# Patient Record
Sex: Female | Born: 1955 | Race: White | Hispanic: No | Marital: Single | Smoking: Never smoker
Health system: Southern US, Community
[De-identification: ages and names within clinical notes are randomized; demographics above are authoritative.]

## PROBLEM LIST (undated history)

## (undated) DIAGNOSIS — E039 Hypothyroidism, unspecified: Secondary | ICD-10-CM

## (undated) HISTORY — DX: Hypothyroidism, unspecified: E03.9

## (undated) HISTORY — PX: DILATION AND CURETTAGE OF UTERUS: SHX78

---

## 1995-07-17 HISTORY — PX: SHOULDER SURGERY: SHX246

## 1998-10-06 ENCOUNTER — Other Ambulatory Visit: Admission: RE | Admit: 1998-10-06 | Discharge: 1998-10-06 | Payer: Self-pay | Admitting: Family Medicine

## 1998-10-31 ENCOUNTER — Other Ambulatory Visit: Admission: RE | Admit: 1998-10-31 | Discharge: 1998-10-31 | Payer: Self-pay | Admitting: Obstetrics and Gynecology

## 1999-04-11 ENCOUNTER — Other Ambulatory Visit: Admission: RE | Admit: 1999-04-11 | Discharge: 1999-04-11 | Payer: Self-pay | Admitting: Obstetrics and Gynecology

## 2000-10-09 ENCOUNTER — Other Ambulatory Visit: Admission: RE | Admit: 2000-10-09 | Discharge: 2000-10-09 | Payer: Self-pay | Admitting: Family Medicine

## 2001-11-07 ENCOUNTER — Other Ambulatory Visit: Admission: RE | Admit: 2001-11-07 | Discharge: 2001-11-07 | Payer: Self-pay | Admitting: Obstetrics and Gynecology

## 2001-11-14 ENCOUNTER — Encounter: Payer: Self-pay | Admitting: Family Medicine

## 2001-11-14 ENCOUNTER — Encounter: Admission: RE | Admit: 2001-11-14 | Discharge: 2001-11-14 | Payer: Self-pay | Admitting: Family Medicine

## 2001-12-24 ENCOUNTER — Encounter: Payer: Self-pay | Admitting: Family Medicine

## 2001-12-24 ENCOUNTER — Encounter: Admission: RE | Admit: 2001-12-24 | Discharge: 2001-12-24 | Payer: Self-pay | Admitting: Family Medicine

## 2002-05-25 ENCOUNTER — Encounter: Admission: RE | Admit: 2002-05-25 | Discharge: 2002-05-25 | Payer: Self-pay

## 2002-06-17 ENCOUNTER — Encounter: Admission: RE | Admit: 2002-06-17 | Discharge: 2002-06-17 | Payer: Self-pay | Admitting: Obstetrics and Gynecology

## 2002-06-17 ENCOUNTER — Encounter: Payer: Self-pay | Admitting: Obstetrics and Gynecology

## 2002-07-13 ENCOUNTER — Encounter (INDEPENDENT_AMBULATORY_CARE_PROVIDER_SITE_OTHER): Payer: Self-pay

## 2002-07-13 ENCOUNTER — Ambulatory Visit (HOSPITAL_COMMUNITY): Admission: RE | Admit: 2002-07-13 | Discharge: 2002-07-13 | Payer: Self-pay

## 2003-12-24 ENCOUNTER — Other Ambulatory Visit: Admission: RE | Admit: 2003-12-24 | Discharge: 2003-12-24 | Payer: Self-pay | Admitting: Family Medicine

## 2003-12-27 ENCOUNTER — Encounter: Admission: RE | Admit: 2003-12-27 | Discharge: 2003-12-27 | Payer: Self-pay | Admitting: Family Medicine

## 2004-02-21 ENCOUNTER — Encounter: Admission: RE | Admit: 2004-02-21 | Discharge: 2004-02-21 | Payer: Self-pay | Admitting: Family Medicine

## 2004-12-22 ENCOUNTER — Other Ambulatory Visit: Admission: RE | Admit: 2004-12-22 | Discharge: 2004-12-22 | Payer: Self-pay | Admitting: Family Medicine

## 2005-03-15 IMAGING — US US TRANSVAGINAL NON-OB
1 series · 14 of 25 positions shown · non-contrast
Comparison: none

CLINICAL DATA: Evaluate left ovary. 
 ULTRASOUND OF THE PELVIS ? TRANSABDOMINAL AND TRANSVAGINAL
 Both transabdominal and transvaginal ultrasound of the pelvis were performed. The uterus is normal in size measuring 9.2 cm sagittally with a depth of 4.2 cm and width of 6.0 cm.  The endometrium is normal measuring 2.2 mm in thickness.  The right ovary measures 2.5 x 1.4 x 1.9 cm with small cysts of less than a cm present.  The left ovary measures 2.1 x 1.2 x 2.0 cm.  No free fluid is seen. 
 IMPRESSION
 Negative pelvic ultrasound.   Small right ovarian follicles are noted.

[Series 1: unknown · 0.26mm/px · 14 of 36 slices shown]
[im 1/36]
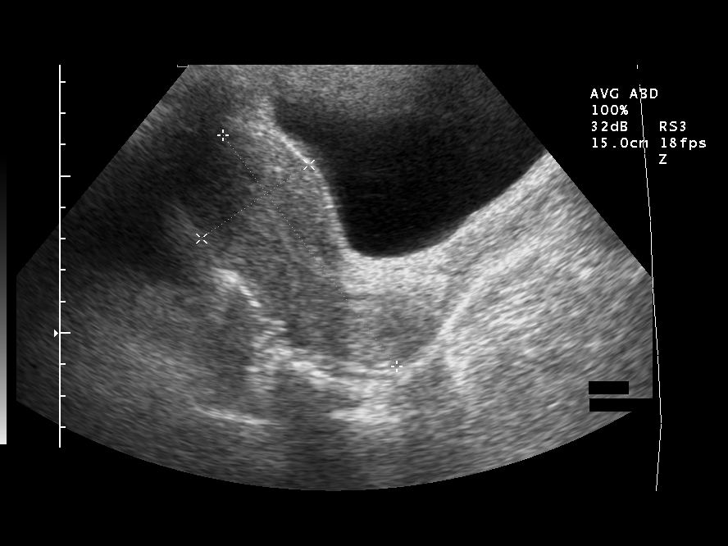
[im 3/36]
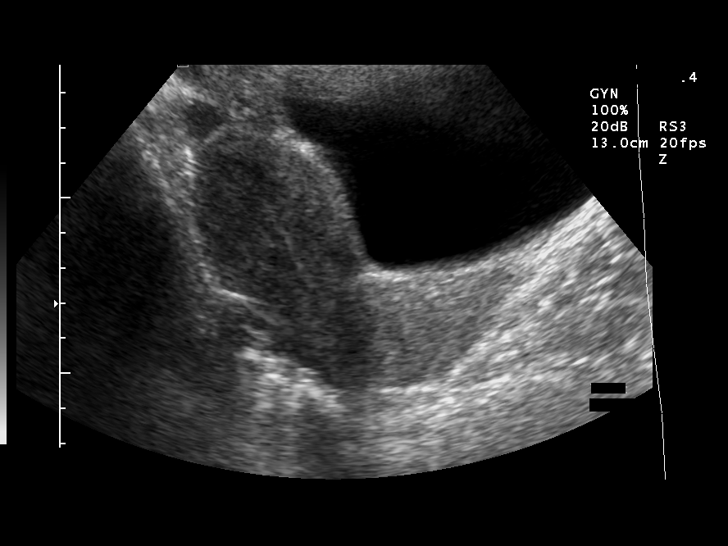
[im 6/36]
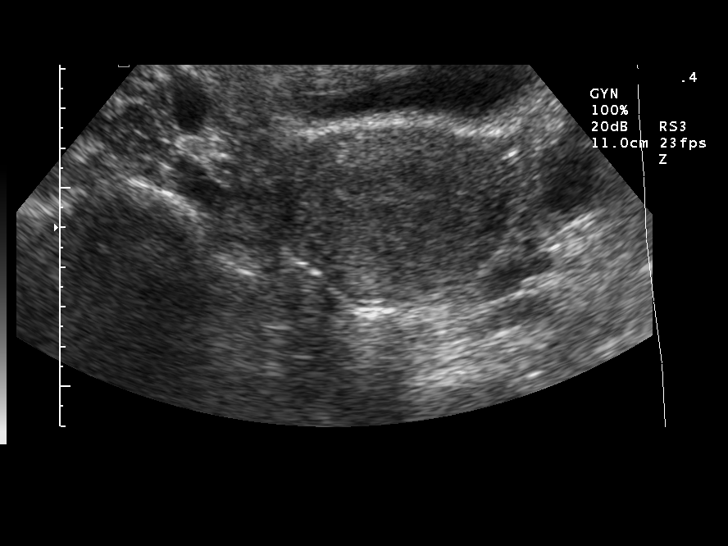
[im 9/36]
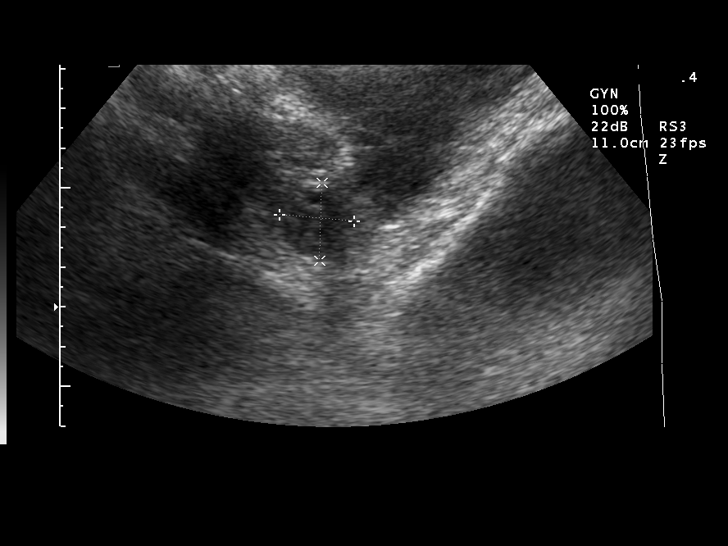
[im 12/36]
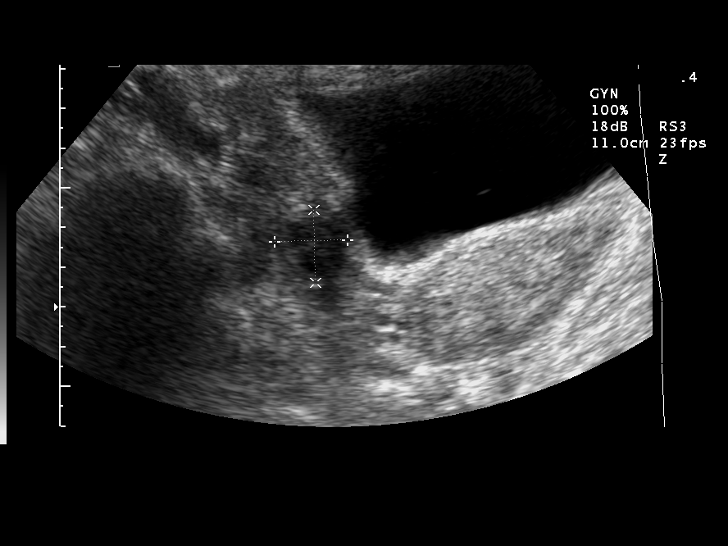
[im 14/36]
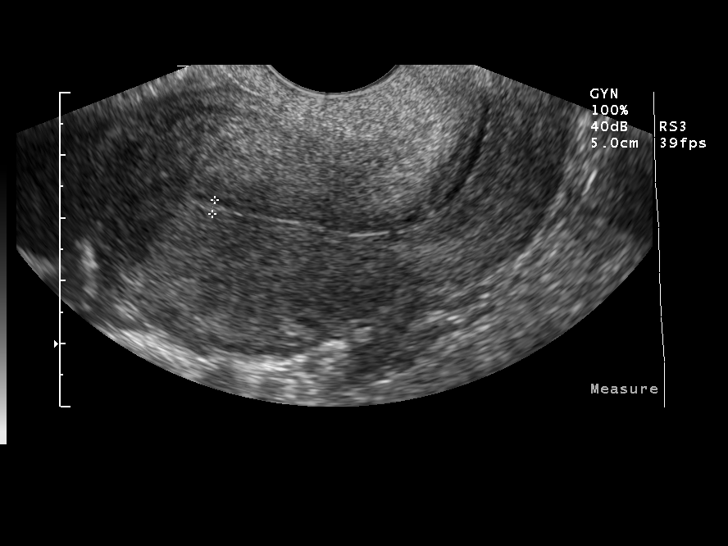
[im 17/36]
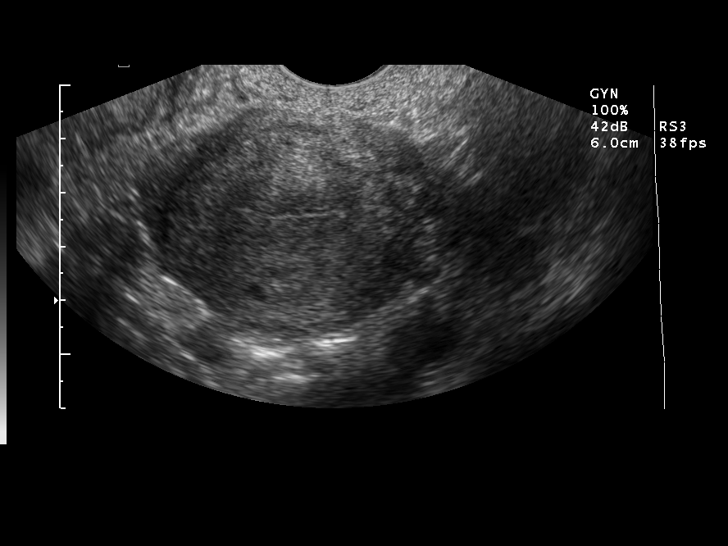
[im 19/36]
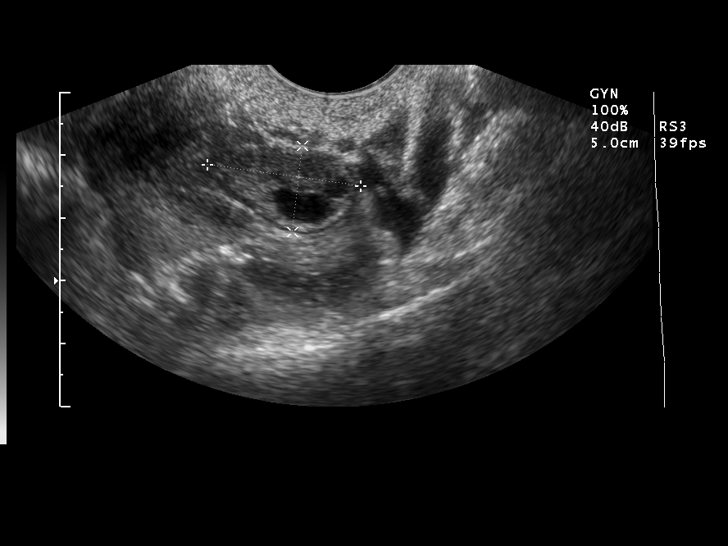
[im 22/36]
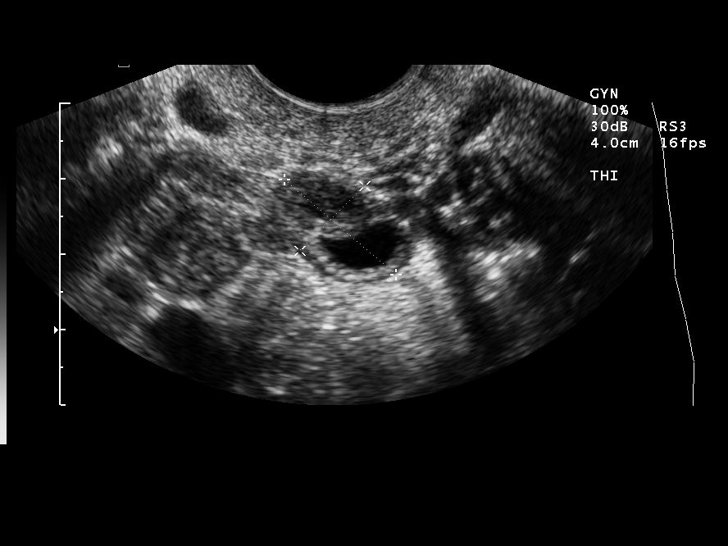
[im 24/36]
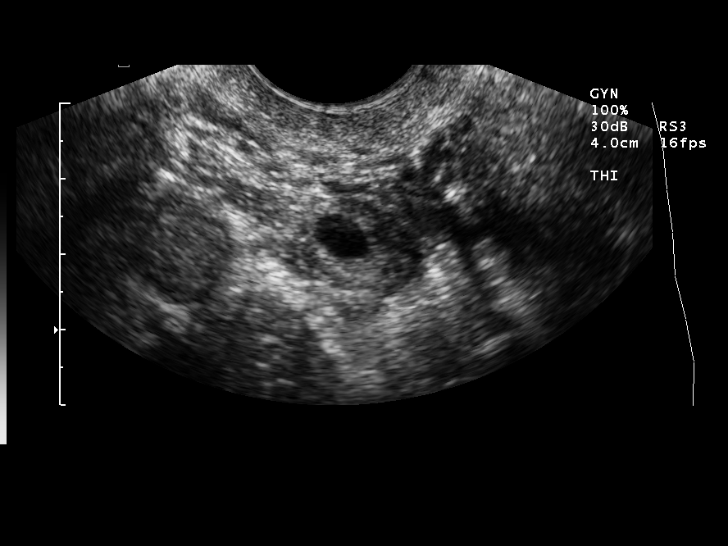
[im 27/36]
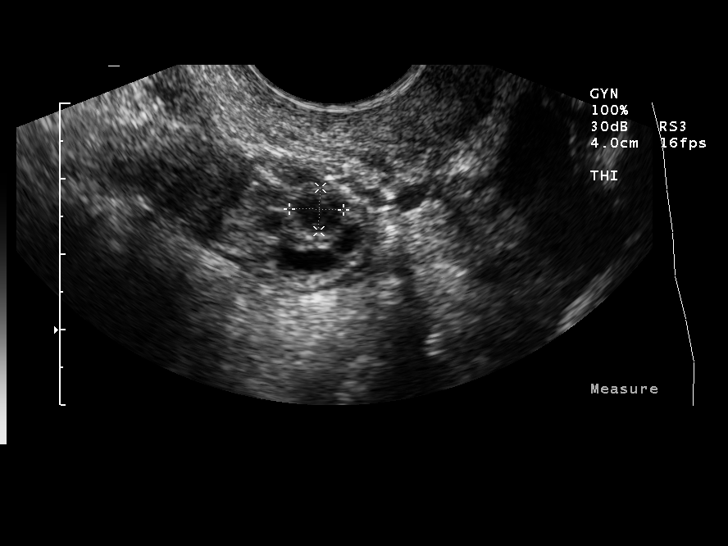
[im 30/36]
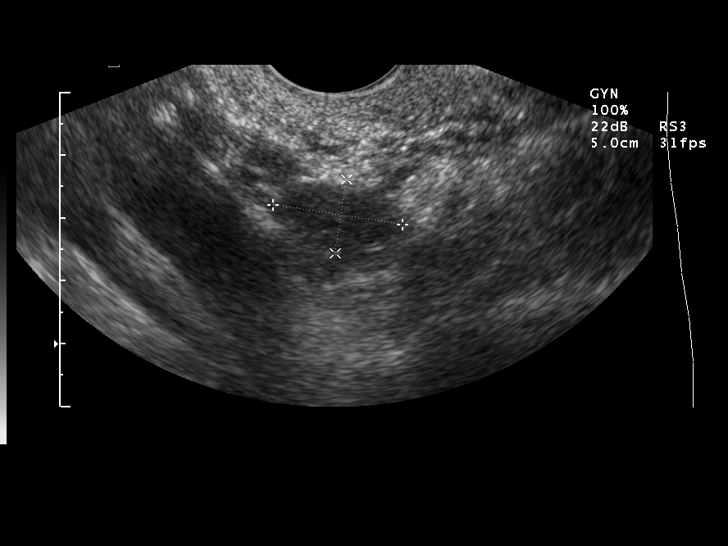
[im 33/36]
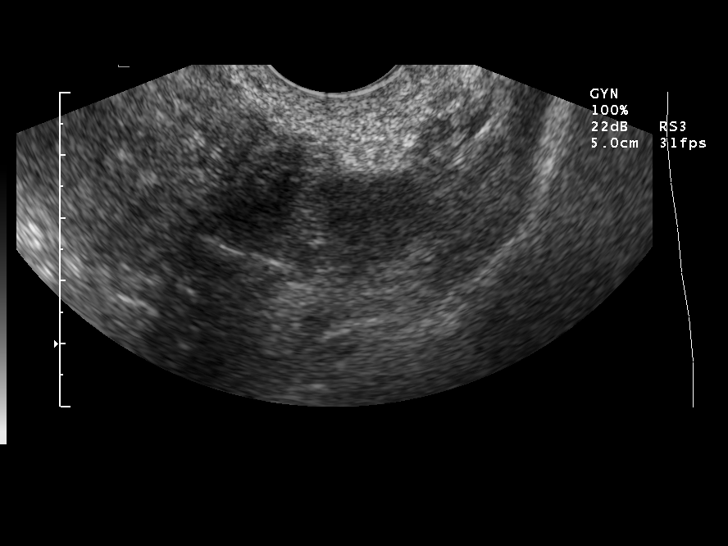
[im 36/36]
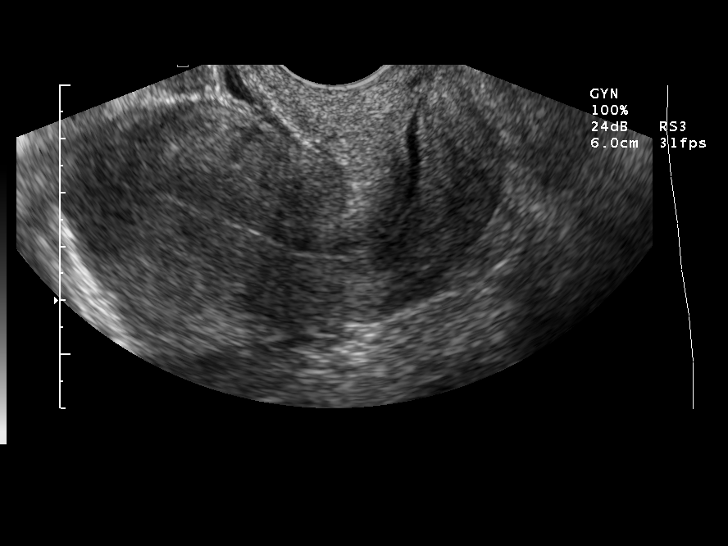

[14 of 25 positions shown; findings below may reference images not displayed]

## 2009-12-09 ENCOUNTER — Encounter: Admission: RE | Admit: 2009-12-09 | Discharge: 2009-12-09 | Payer: Self-pay | Admitting: Family Medicine

## 2010-11-15 ENCOUNTER — Other Ambulatory Visit: Payer: Self-pay | Admitting: Family Medicine

## 2010-11-15 DIAGNOSIS — E042 Nontoxic multinodular goiter: Secondary | ICD-10-CM

## 2010-12-01 NOTE — Op Note (Signed)
NAME:  Anita Odonnell, BARRACO                          ACCOUNT NO.:  [id number]   MEDICAL RECORD NO.:  [id number]                   PATIENT TYPE:  AMB   LOCATION:  SDC                                  FACILITY:  [facility]   PHYSICIAN:  Ronda Fairly. Galen Daft, M.D.              DATE OF BIRTH:  06/27/1956   DATE OF PROCEDURE:  DATE OF DISCHARGE:  07/13/2002                                 OPERATIVE REPORT   PREOPERATIVE DIAGNOSES:  1. Menometorrhagia.  2. Uterine polyps.   POSTOPERATIVE DIAGNOSES:  1. Menometrorrhagia.  2. Uterine polyps.  3. Submucosal uterine fibroids, intracavitary.   PROCEDURE:  Hysteroscopy with removal of uterine polyps and hysteroscopic  myomectomy.   SURGEON:  Ronda Fairly. Galen Daft, M.D.   ANESTHESIA:  MAC and local.   COMPLICATIONS:  None.   ESTIMATED BLOOD LOSS:  Less than 10 cc.   SPECIMENS:  Uterine curettage and polyp material as well as uterine fibroid.   DESCRIPTION OF PROCEDURE:  The patient was identified as [name].  She  went into the operating room after informed consent regarding the procedure.  The patient received 10 cc of 1% lidocaine for anesthesia and the  hysteroscopy revealed evidence of a large intracavitary uterine fibroid.  This was thought to be the primary source of her symptoms of bleeding and  therefore this was also amenable to hysteroscopic resection.  Hysteroscopy  revealed evidence of a large intracavity uterine fibroid.  This was thought  to be the primary source of her symptoms of bleeding and therefore this was  also amenable to hysteroscopic resection.  Using the resectoscope initially  with the single loop and then followed by the double loop enough passes were  made to remove the fibroid completely.  The optimal cutting current was 80  watts.  The double loop optimal cutting current was 100 watts.  Ball cautery  was utilized at the end just to touch up one area that was bleeding.  Otherwise, it was completely hemostatic.  The  double loop optimal cutting  current was 100 watts.  Ball cautery was utilized at the end just to touch  up one area that was bleeding.  Otherwise, it was completely hemostatic.  There was no excessive blood loss.  The polyps had been previously removed  with curettage and polyp forceps at the beginning of the procedure.  The  fibroid was thought to be removed in its entirety and there was complete  hemostasis at the end.  All instrument, sponge and needle counts were  correct at the end of the case.  The patient tolerated the procedure quite  well and left the operating room in stable condition.  The total fluid  deficit was 60 cc or less.  Ronda Fairly. Galen Daft, M.D.   NJT/MEDQ  D:  07/13/2002  T:  07/13/2002  Job:  [id number]

## 2010-12-12 ENCOUNTER — Other Ambulatory Visit: Payer: Self-pay

## 2010-12-29 ENCOUNTER — Ambulatory Visit
Admission: RE | Admit: 2010-12-29 | Discharge: 2010-12-29 | Disposition: A | Discharge status: Home / Self Care | Payer: 59 | Source: Ambulatory Visit | Attending: Family Medicine | Admitting: Family Medicine

## 2010-12-29 DIAGNOSIS — E042 Nontoxic multinodular goiter: Secondary | ICD-10-CM

## 2011-02-26 IMAGING — US US SOFT TISSUE HEAD/NECK
1 series · 14 of 25 positions shown · non-contrast
Comparison: Report from thyroid ultrasound from [REDACTED] dated
07/04/2009.

CLINICAL DATA: Follow up thyroid nodules.

THYROID ULTRASOUND
TECHNIQUE: Ultrasound examination of the thyroid gland and
adjacent soft tissues was performed.

[Series 1: us soft tissue head/neck · 0.05mm/px · 14 of 30 slices shown]
[im 1/30]
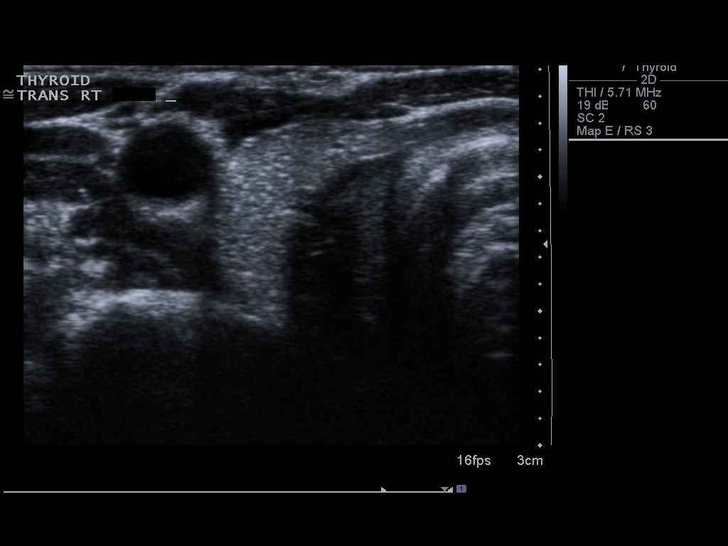
[im 3/30]
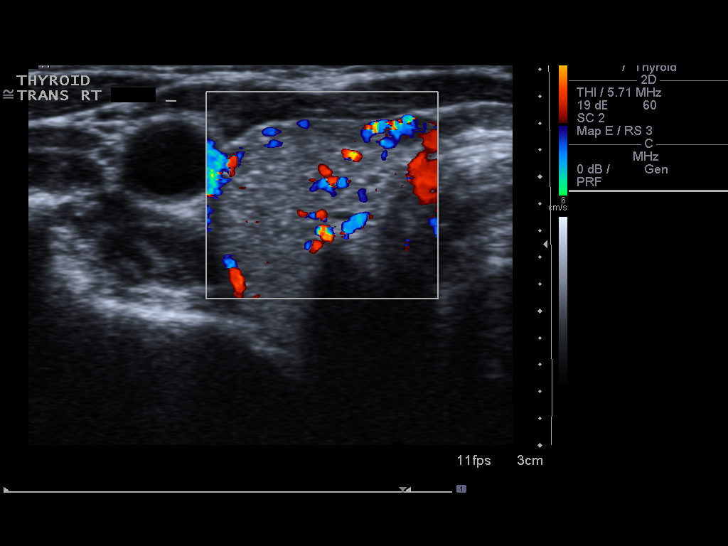
[im 5/30]
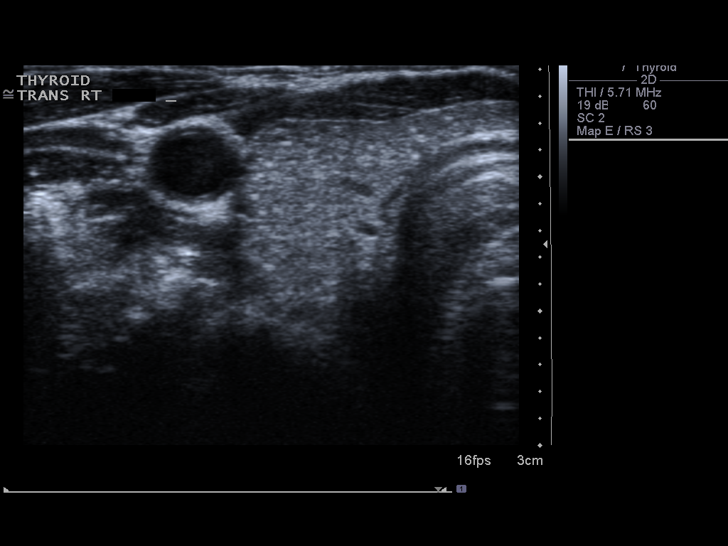
[im 8/30]
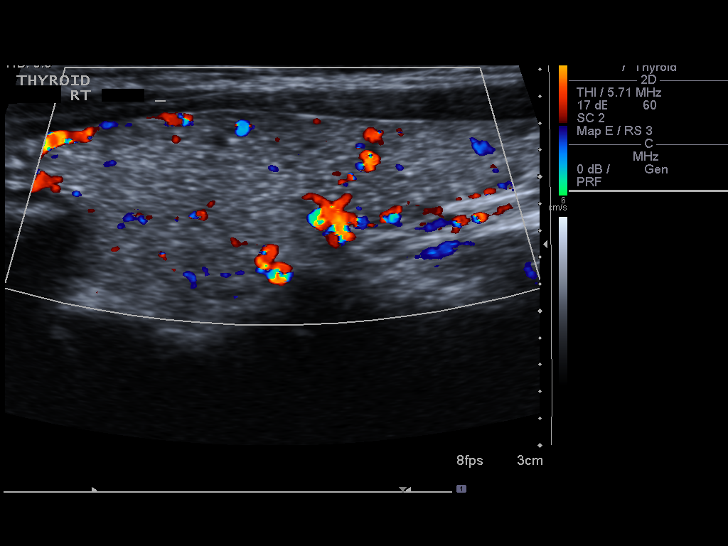
[im 10/30]
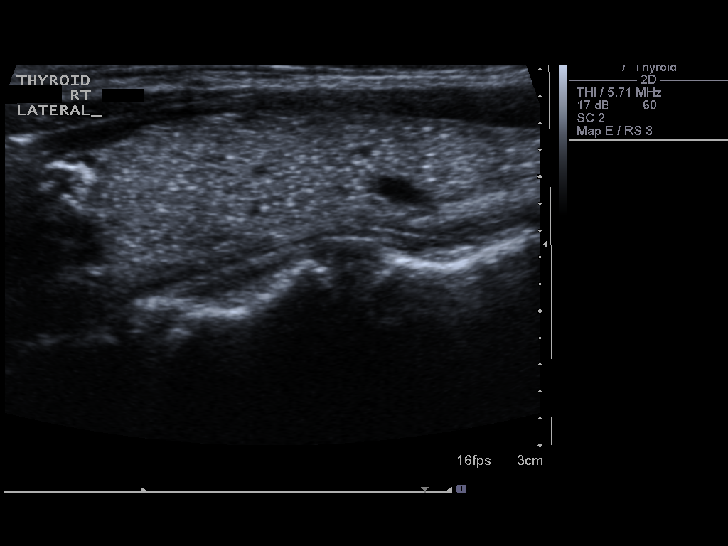
[im 11/30]
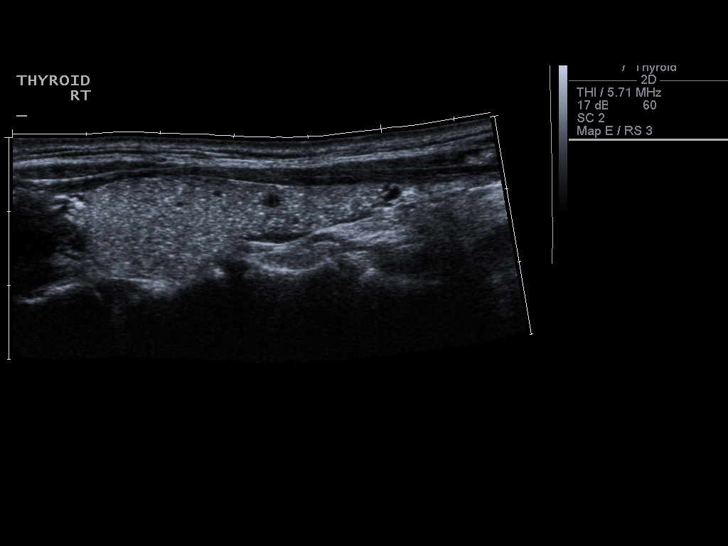
[im 14/30]
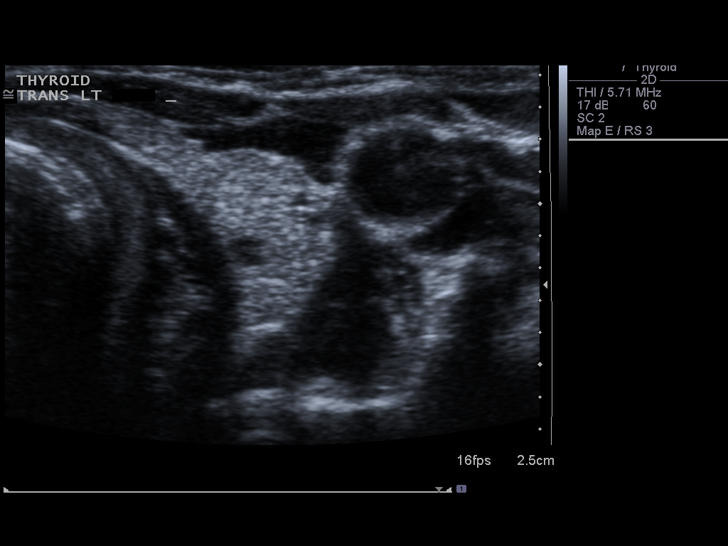
[im 16/30]
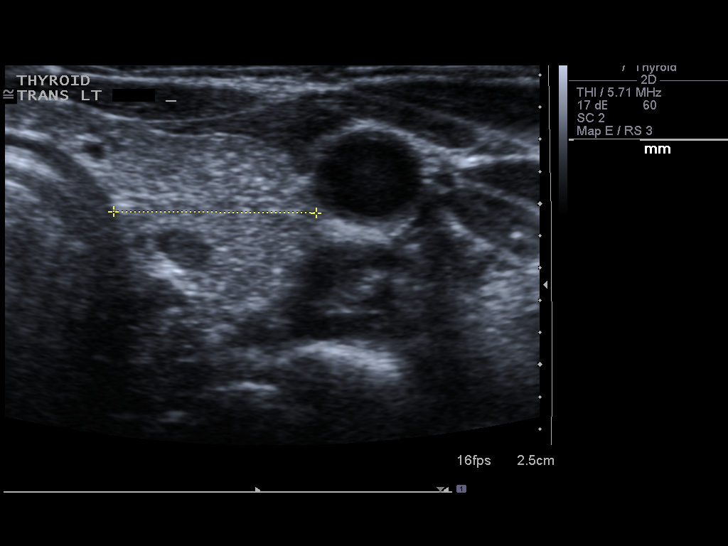
[im 19/30]
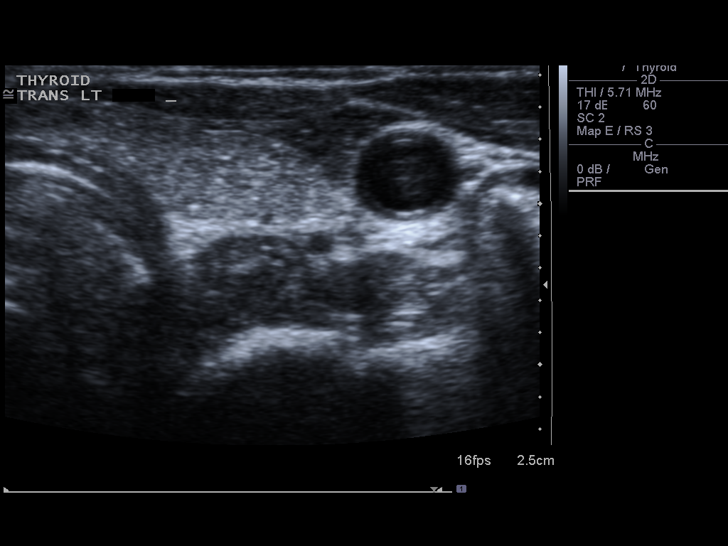
[im 20/30]
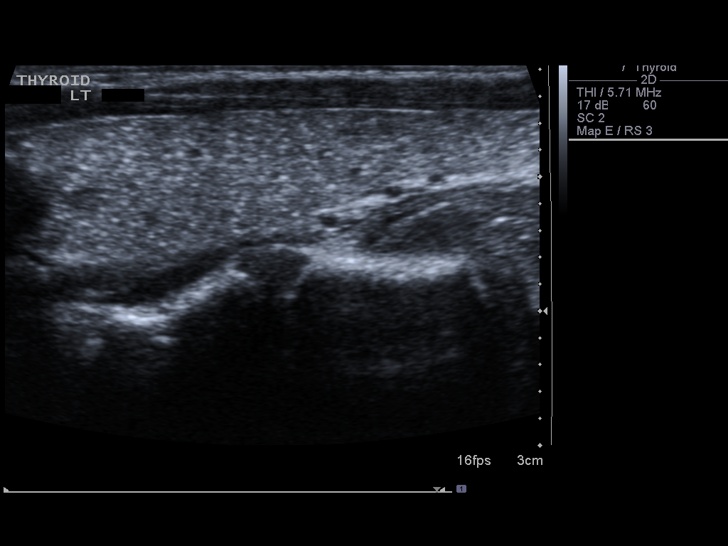
[im 22/30]
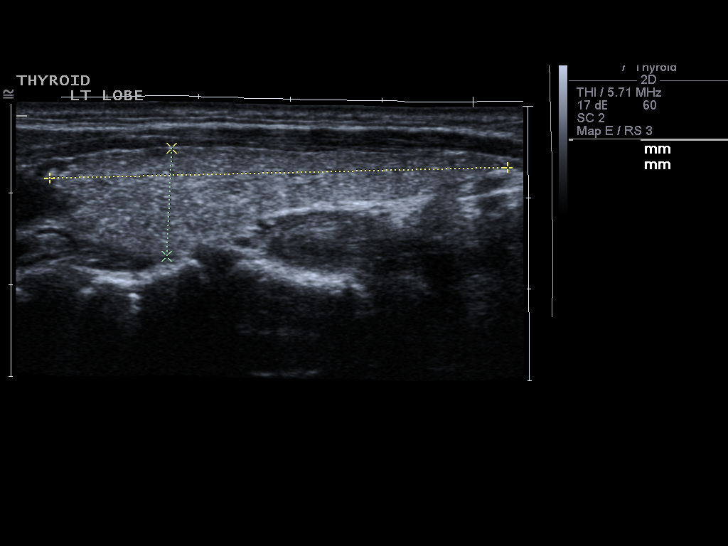
[im 25/30]
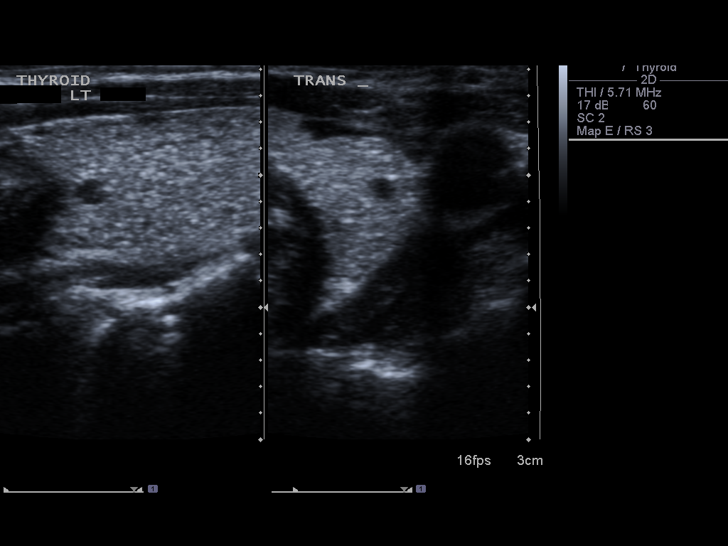
[im 27/30]
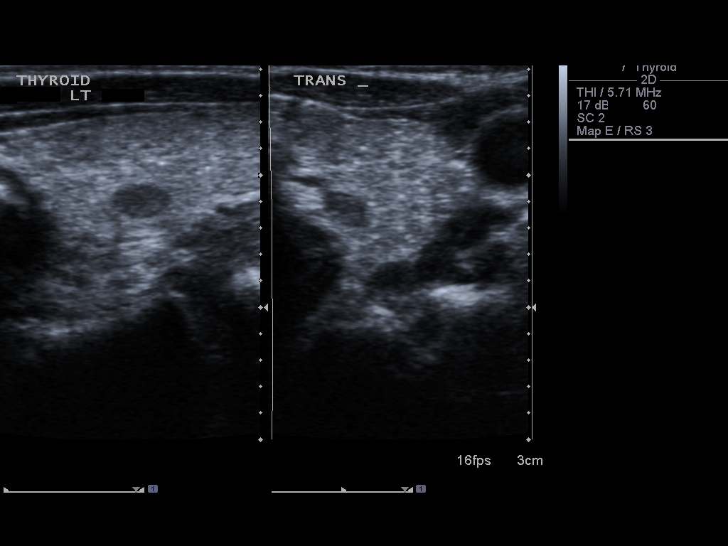
[im 30/30]
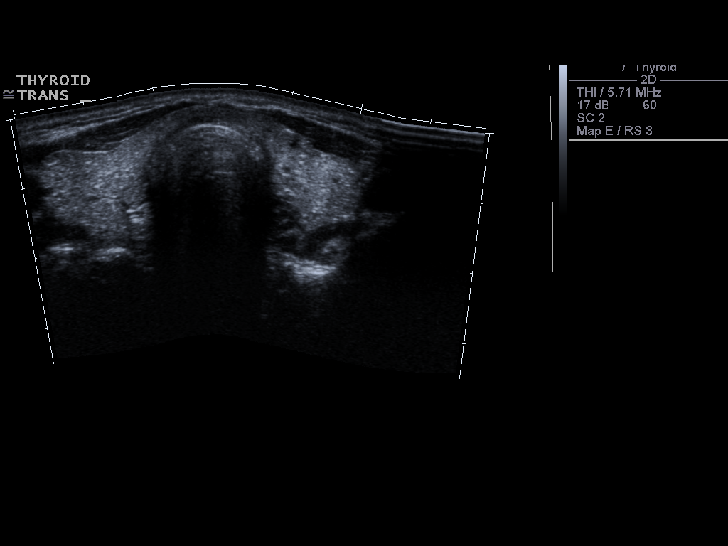

[14 of 25 positions shown; findings below may reference images not displayed]

FINDINGS: The right thyroid lobe measures 423 x 1.5 x 1.3 cm.  The
left lobe measures 5.0 x 1.2 x 1.3 cm.  The isthmus measures
cm.  The thyroid echotexture is heterogeneous.  There are tiny
speckled hyperechoic areas which are likely tiny calcifications.

There are two sub 5 mm lesions in both thyroid lobes.  These are
unchanged based on the previous report.
IMPRESSION: 1.  Probable tiny speckled calcifications throughout the thyroid
gland.
2.  Four small, sub 5 mm, nodules unchanged based on prior report.
3.  Recommend follow up thyroid ultrasound in 1 year.

## 2011-08-15 ENCOUNTER — Ambulatory Visit (INDEPENDENT_AMBULATORY_CARE_PROVIDER_SITE_OTHER): Payer: 59 | Admitting: Sports Medicine

## 2011-08-15 ENCOUNTER — Encounter: Payer: Self-pay | Admitting: Sports Medicine

## 2011-08-15 VITALS — BP 109/75 | HR 64 | Ht 66.0 in | Wt 123.0 lb

## 2011-08-15 DIAGNOSIS — M25522 Pain in left elbow: Secondary | ICD-10-CM | POA: Insufficient documentation

## 2011-08-15 DIAGNOSIS — M7710 Lateral epicondylitis, unspecified elbow: Secondary | ICD-10-CM

## 2011-08-15 DIAGNOSIS — M25529 Pain in unspecified elbow: Secondary | ICD-10-CM

## 2011-08-15 DIAGNOSIS — M7712 Lateral epicondylitis, left elbow: Secondary | ICD-10-CM

## 2011-08-15 MED ORDER — KETOPROFEN POWD: Status: DC

## 2011-08-15 NOTE — Assessment & Plan Note (Signed)
Begin std rehab exercises  Modify lifting exercise  Try topical NSAIDs  If not improved in 4 wks try NTG Tx/  Reck then

## 2011-08-15 NOTE — Assessment & Plan Note (Signed)
We will use top ketoprofen for pain relief Ice massage  Try compresson sleeve for activity

## 2011-08-15 NOTE — Progress Notes (Signed)
  Subjective:    Patient ID: Anita Odonnell, female    DOB: 1956-06-16, 56 y.o.   MRN: [id number]  HPI  Pt presents to clinic for evaluation of L elbow pain x 5 weeks. Started after lifting weights- biceps, triceps, cling and presses in bodypump class. Now hurts with activities like playing volleyball, fastening seatbelt, tying shoes. Tender at point of lt lateral epicondyle.  Note it does sound like she was trying to use too much weight for exercise in this class  Comes for eval    Review of Systems     Objective:   Physical Exam NAD  Neg book test ttp lt lat epicondyle Pain with resisted extension of lt wrist Grip squeeze test positive Resistance of finger flexion or extension has pain on lat epicondyle 5 deg hyperextention bilat elbows No hyperextension at knees  MSK [place] Small avulsion at tip of left lat epicondyle Hypoechoic split in extensor tendon group neovessel present No discreet tears        Assessment & Plan:

## 2011-08-15 NOTE — Patient Instructions (Signed)
Do suggested exercises daily  Rub ketoprofen gel onto left elbow up to 4 times per day as needed- this was sent to [facility]  Ice massages on elbow is ok- avoid using an ice pack directly over the area  Use compression sleeve for activity  Please follow up in 4 weeks   Thank you for seeing [place] today!

## 2011-09-12 ENCOUNTER — Ambulatory Visit (INDEPENDENT_AMBULATORY_CARE_PROVIDER_SITE_OTHER): Payer: 59 | Admitting: Sports Medicine

## 2011-09-12 VITALS — BP 100/70

## 2011-09-12 DIAGNOSIS — M25522 Pain in left elbow: Secondary | ICD-10-CM

## 2011-09-12 DIAGNOSIS — M7710 Lateral epicondylitis, unspecified elbow: Secondary | ICD-10-CM

## 2011-09-12 DIAGNOSIS — M25529 Pain in unspecified elbow: Secondary | ICD-10-CM

## 2011-09-12 DIAGNOSIS — M7712 Lateral epicondylitis, left elbow: Secondary | ICD-10-CM

## 2011-09-12 NOTE — Assessment & Plan Note (Addendum)
Overall clinically improving. Will place on regimen of progressive overload regimen to help with lateral epicondyle/elbow strengthening.  Follow up in 6 weeks.

## 2011-09-12 NOTE — Progress Notes (Signed)
  Subjective:    Patient ID: Anita Odonnell, female    DOB: 1955/08/21, 56 y.o.   MRN: [id number]  HPI Pt presents today for follow up visit of L elbow with noted lateral epicondylitis. Initial mechanism of injury was using too much weight with body pump. Pt was placed on ketoprofen gel and counter force strap in conjuction with elbow rehab regimen. Pt also had noted hyperechoic changes on elbow u/s.  Pt states that pain and function is approx 15% improved from last clinical visit. Stiffness has been most predominant symptom that has improved. Pt does feel frustrated about being able to do certain exercises including arm flies. Does feel good that several exercises now cause no pain. Pt states that she was instructed to avoid push ups previously, though pt states that this does not elicit elbow pain. Pt has been compliant with ketoprofen gel 2-3 times per day..    Review of Systems See HPI, otherwise ROS negative.     Objective:   Physical Exam Gen: in bed, NAD Neg book test  ttp lt lat epicondyle  Pain with resisted extension of lt wrist  Grip squeeze test positive  Resistance of finger flexion or extension has pain on lat epicondyle  5 deg hyperextention bilat elbows  No hyperextension at knees        Assessment & Plan:

## 2011-09-13 NOTE — Assessment & Plan Note (Signed)
Gradual improvement and I think we can cont with topcial NSAIDs

## 2011-09-14 ENCOUNTER — Telehealth: Payer: Self-pay | Admitting: *Deleted

## 2011-09-14 NOTE — Telephone Encounter (Signed)
Called pt, left VM to return my call

## 2011-09-14 NOTE — Telephone Encounter (Signed)
Pt called back while i was out of the office.  Returned her call and left another VM.

## 2011-09-14 NOTE — Telephone Encounter (Signed)
Spoke with pt.  She states she has been using ketoprofen for about 1 month and in the past 2 days has broken out in a rash that itches over her elbow where she had been rubbing the gel.  Advised her to d/c use of ketoprofen.  She is asking if there is something else she should use.  Suggested that if she needed something for pain this weekend to try ibuprofen or aleve, and i will check with Dr. Darrick Penna Monday about whether he thinks she should try diclofenac gel.

## 2011-09-14 NOTE — Telephone Encounter (Signed)
Message copied by Mora Bellman on Fri Sep 14, 2011 11:27 AM ------      Message from: Lizbeth Bark      Created: Fri Sep 14, 2011 10:09 AM      Regarding: phone message      Contact: [phone]       Patient states she has ltchy,bumps on the elbow where she is applying the Ketoprofen gel.

## 2011-09-18 NOTE — Telephone Encounter (Signed)
Per Dr. Darrick Penna- advised pt to stop ketoprofen gel for 1 week then restart.  If she has the same reaction when she restarts then she will know it was the gel.  Pt agreeable.  She states her elbow has been feeling much better in the past week.

## 2011-10-25 ENCOUNTER — Ambulatory Visit: Payer: 59 | Admitting: Sports Medicine

## 2011-11-08 ENCOUNTER — Ambulatory Visit (INDEPENDENT_AMBULATORY_CARE_PROVIDER_SITE_OTHER): Payer: 59 | Admitting: Sports Medicine

## 2011-11-08 ENCOUNTER — Encounter: Payer: Self-pay | Admitting: Sports Medicine

## 2011-11-08 DIAGNOSIS — M7710 Lateral epicondylitis, unspecified elbow: Secondary | ICD-10-CM

## 2011-11-08 DIAGNOSIS — M25529 Pain in unspecified elbow: Secondary | ICD-10-CM

## 2011-11-08 DIAGNOSIS — M7712 Lateral epicondylitis, left elbow: Secondary | ICD-10-CM

## 2011-11-08 DIAGNOSIS — M25522 Pain in left elbow: Secondary | ICD-10-CM

## 2011-11-08 NOTE — Progress Notes (Signed)
  Subjective:    Patient ID: Anita Odonnell, female    DOB: 1956/01/22, 56 y.o.   MRN: [id number]  HPI  Pt presents to clinic for f/u of lt lateral epicondylitis which she reports is 90% improved. She has been consistent with home exercises. Not using ketoprofen regularly 2/2 skin irritation, and she was improving without it. Has some discomfort with triceps push downs in the gym, avoids these for now.  Also has some lt hip pain in groin area since stretching after waking up one morning.    Review of Systems     Objective:   Physical Exam  Lt elbow exam:  Negative book test on lt Full elbow ROM, with hyperextension Minimal tenderness to palpation lt lateral epicondyle Some elbow pain on resisted finger and wrist extension  Hip exam 80 deg ER, 45 deg IR on rt 60 deg ER, 45 deg IR on lt  FABER normal bilat tighter on lt Lt hip adduction strong Sartorius normal on lt Hip flexion normal on lt Lt hip abduction strong  Musculoskeletal ultrasound Left elbow shows a minor avulsion fragment at the tip of the lateral epicondyle The extensor tendon now looks normal and does not show abnormal edema Doppler flow is normal as well      Assessment & Plan:  Hip pain  I think her left hip pain is from a strain of her capsule probably related to some of her hypermobility She has excessive motion around her hips just that she does her elbows I advised getting this time and keep it easy motion exercises. We will recheck if it does not resolve.

## 2011-11-08 NOTE — Patient Instructions (Addendum)
Continue home exercises 2-3 times per week to prevent tennis elbow from coming back  Use ketoprofen as needed for pain  Do ice massages after hard work outs  Use elbow sleeve for volleyball  Be careful not to hyperextend elbow  Please follow up as needed  Thank you for seeing [place] today!

## 2011-11-08 NOTE — Assessment & Plan Note (Signed)
Pain now is rare and she has stopped using medications

## 2011-11-08 NOTE — Assessment & Plan Note (Signed)
This is 90% healed clinically and the ultrasound scan is essentially normal  Continued the exercises 2 or 3 times a week for maintenance  For volleyball I would use the elbow sleeve to help guard against hyperextension  We will recheck as needed

## 2012-03-17 IMAGING — US US SOFT TISSUE HEAD/NECK
1 series · 14 of 25 positions shown · non-contrast
Comparison: 12/09/2009

CLINICAL DATA: History of small bilateral thyroid nodules.

THYROID ULTRASOUND
TECHNIQUE: Ultrasound examination of the thyroid gland and adjacent
soft tissues was performed.

[Series 1: us soft tissue head/neck · 0.07mm/px · 14 of 42 slices shown]
[im 1/42]
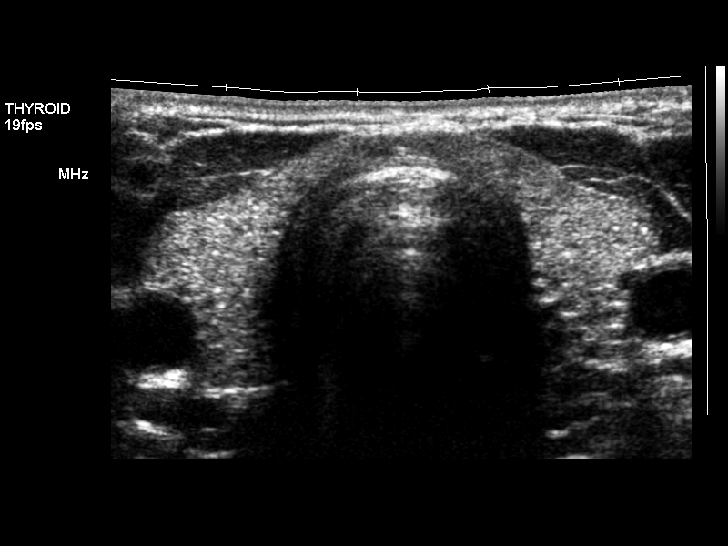
[im 4/42]
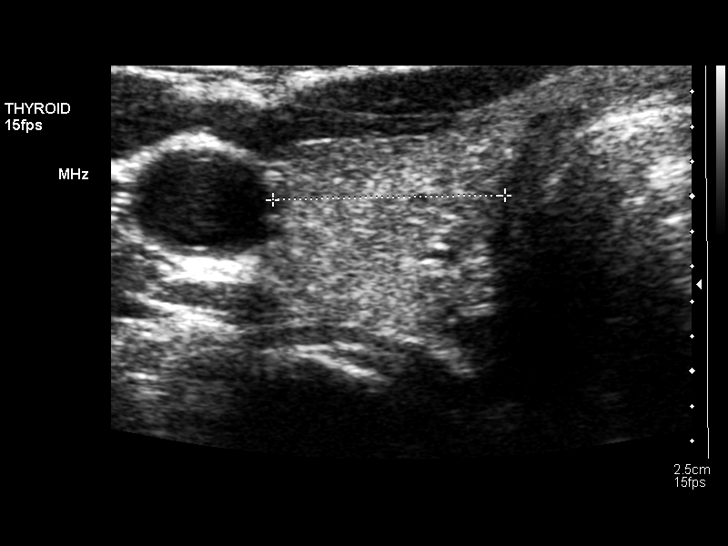
[im 7/42]
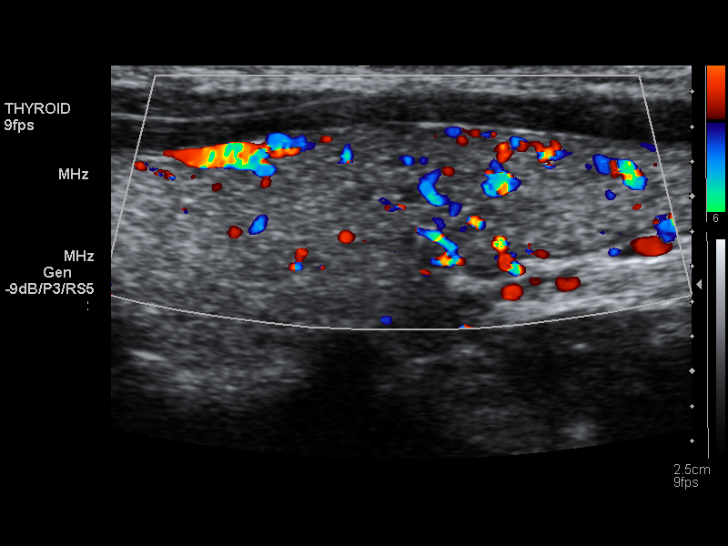
[im 11/42]
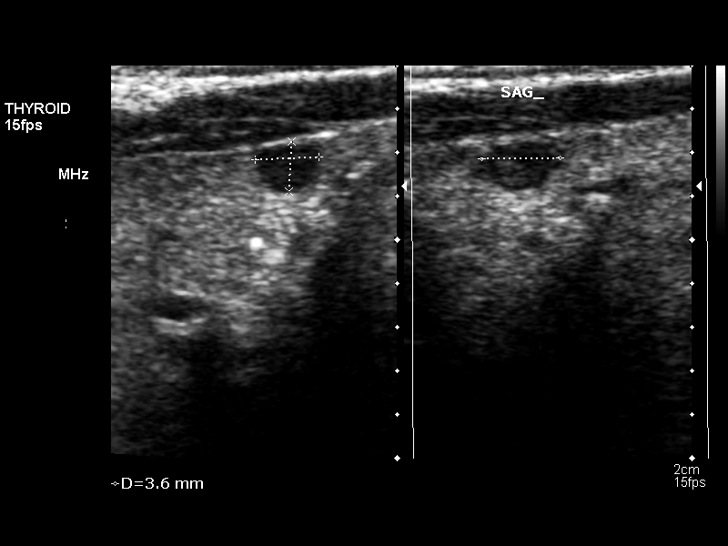
[im 14/42]
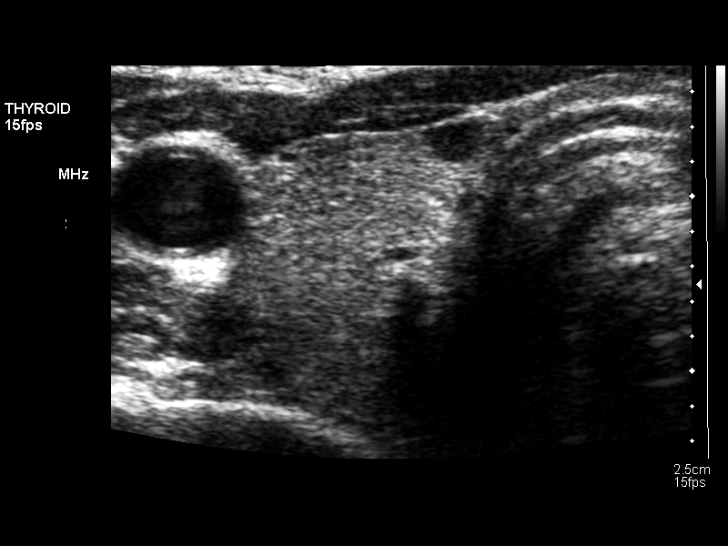
[im 16/42]
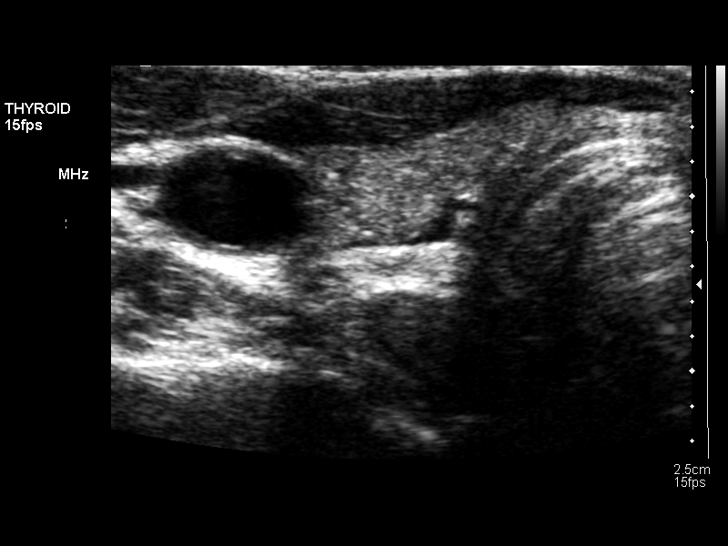
[im 19/42]
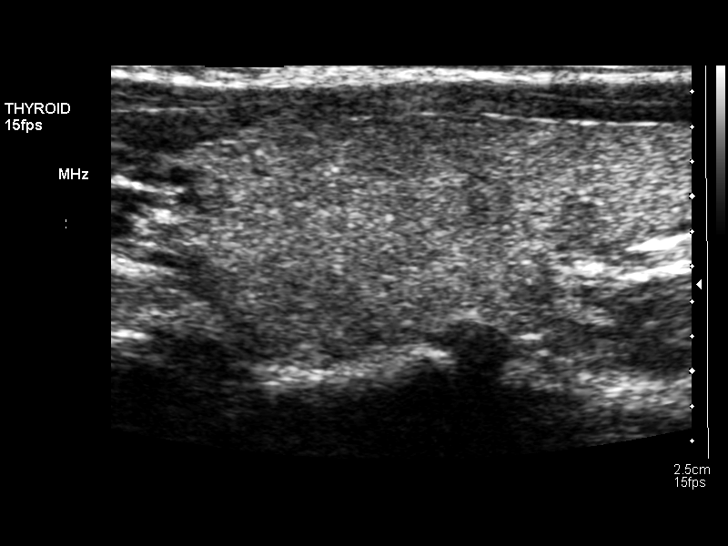
[im 23/42]
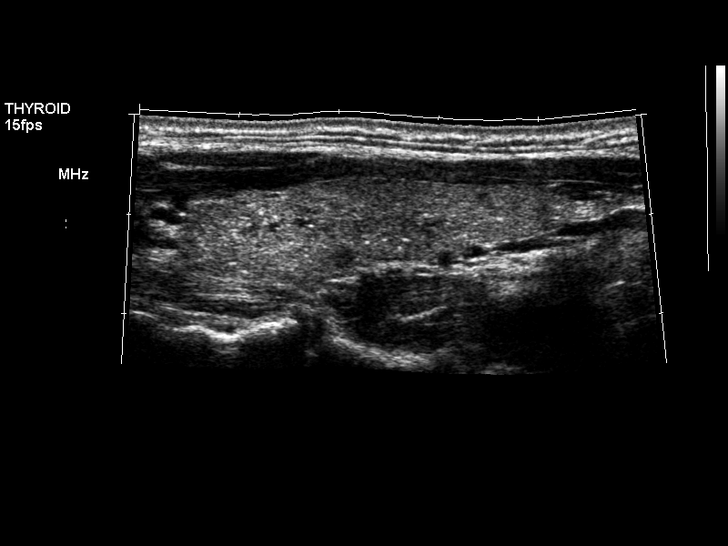
[im 26/42]
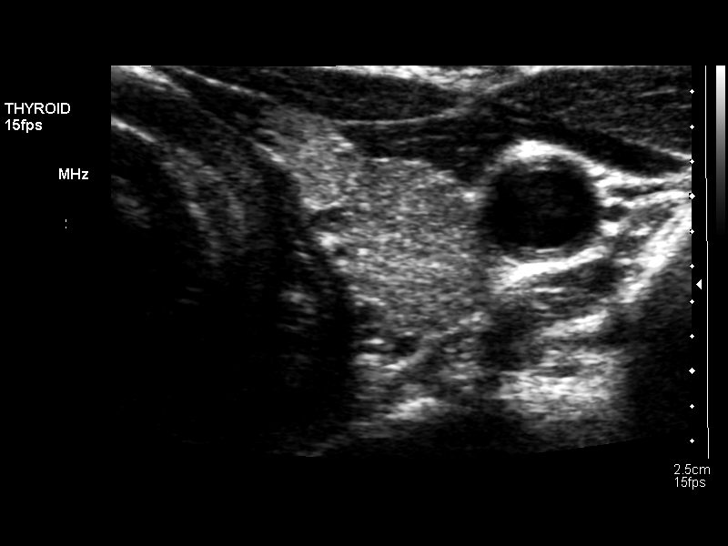
[im 28/42]
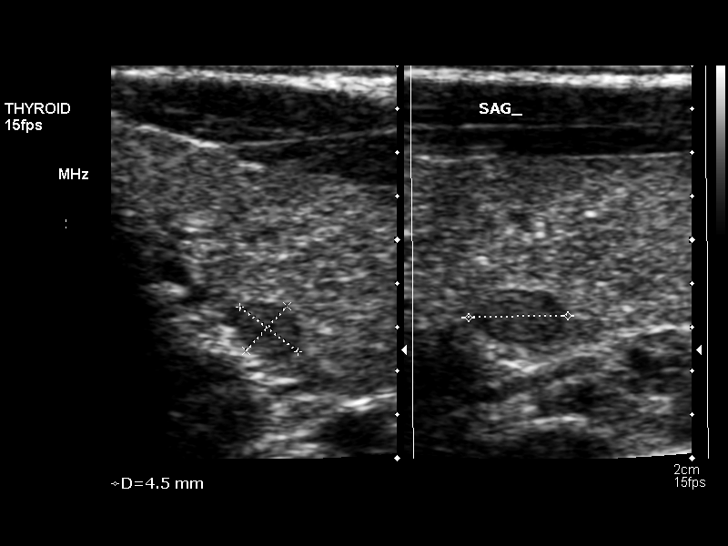
[im 31/42]
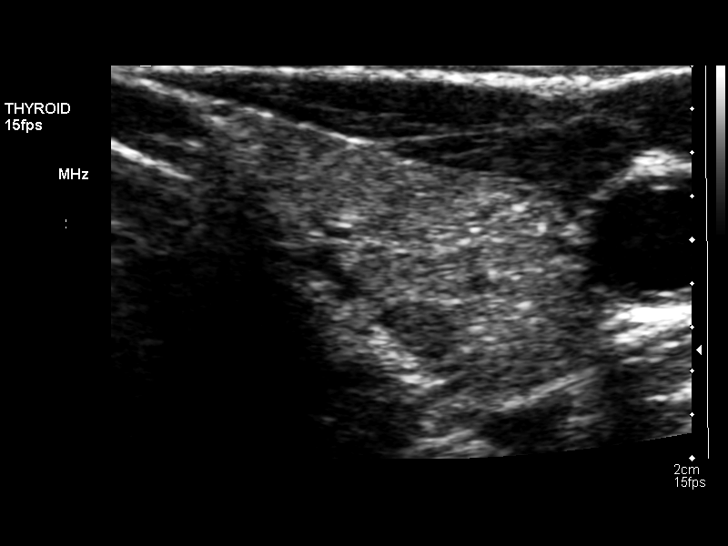
[im 35/42]
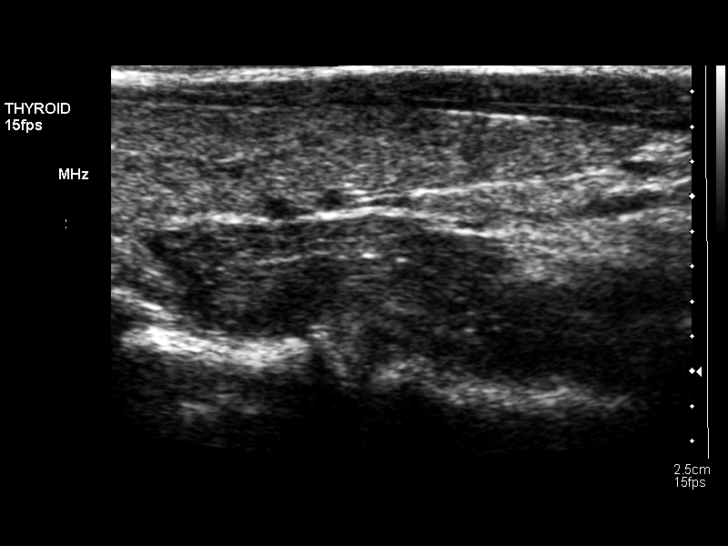
[im 38/42]
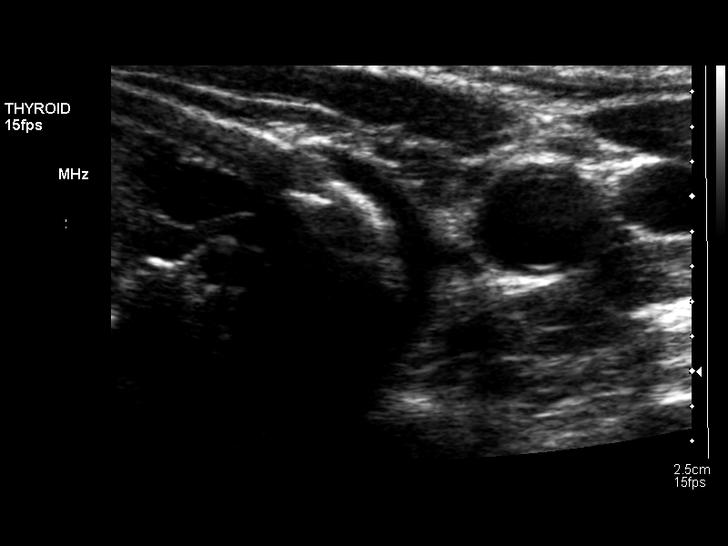
[im 42/42]
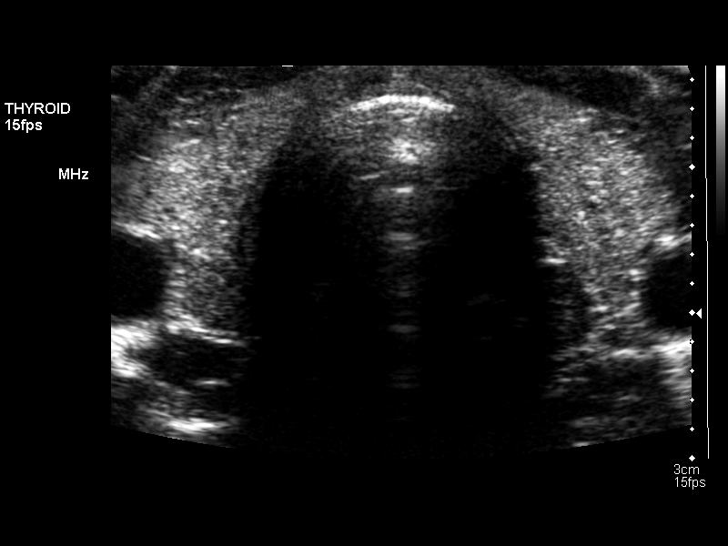

[14 of 25 positions shown; findings below may reference images not displayed]

FINDINGS: Right thyroid lobe:  4.0 x 1.2 x 1.3 cm
Left thyroid lobe:  4.6 x 1.2 x 1.1 cm
Isthmus:  0.2 cm

Thyroid dimensions are roughly stable.  The heterogeneous
echotexture of the thyroid gland noted previously is stable with
some scattered small calcifications again noted throughout the
gland.

Focal nodules:  Currently, there are only two visible measurable
small hypoechoic nodules.  A 0.4 cm right nodule and a 0.5 cm left
nodule are identified.  These are stable in size and appearance
compared with the prior scan.  I currently do not see any further
need for routine follow-up thyroid imaging unless there are
clinical indications to image the thyroid gland.

Lymphadenopathy:  None visualized.
IMPRESSION: Stable thyroid ultrasound as above with stable tiny benign
appearing nodules.

## 2012-04-10 ENCOUNTER — Ambulatory Visit (INDEPENDENT_AMBULATORY_CARE_PROVIDER_SITE_OTHER): Payer: 59 | Admitting: Sports Medicine

## 2012-04-10 ENCOUNTER — Encounter: Payer: Self-pay | Admitting: Sports Medicine

## 2012-04-10 VITALS — BP 112/75 | HR 60 | Ht 66.0 in | Wt 120.0 lb

## 2012-04-10 DIAGNOSIS — S83249A Other tear of medial meniscus, current injury, unspecified knee, initial encounter: Secondary | ICD-10-CM | POA: Insufficient documentation

## 2012-04-10 DIAGNOSIS — M25569 Pain in unspecified knee: Secondary | ICD-10-CM

## 2012-04-10 NOTE — Assessment & Plan Note (Addendum)
New problem Knee pain secondary to small medial meniscus tear seen on musculoskeletal ultrasound.   Patient does not have any significant mechanical symptoms or significant swelling. She doesn't have pain with her normal activities of daily living.   Discussed options. Patient elects for conservative attempt. Plan for modification of activity to avoid any deep knee bends lunging jumping or heavy running.  Additionally we will use a compressive knee sleeve.  We'll followup in 6 weeks if not better. Warned her that meniscus injuries can sometimes not heal and require surgery.   This appears degenerative and we should be able to [facility] with good HEP

## 2012-04-10 NOTE — Progress Notes (Signed)
Anita Odonnell is a 56 y.o. female who presents to [facility] today for right medial knee pain. Present for 2 months. Worse with running jumping and deep knee bends. No locking catching or giving way. No significant swelling. Pain is mild to moderate more bothersome than severe. Worse with activity better with rest. She does circuit training , weight exercises with some running. She notes the lunges, squats and running are somewhat eliciting.    Her elbow pain has resolved.    PMH reviewed. Healthy History  Substance Use Topics  . Smoking status: Never Smoker   . Smokeless tobacco: Not on file  . Alcohol Use: Not on file   ROS as above otherwise neg   Exam:  BP 112/75  Pulse 60  Ht 5\' 6"  (1.676 m)  Wt 120 lb (54.432 kg)  BMI 19.37 kg/m2 Gen: Well NAD MSK: Right knee normal-appearing no effusion. Range of motion 0-130 1+ patellar crepitations Mildly tender over the medial joint line nontender elsewhere Strength is preserved to extension and flexion Negative Lachman's McMurray's valgus and varus stress and Thessaly's test.   Musculoskeletal ultrasound of the right knee: Quad tendon: Normal appearing on long and short views. Mild knee effusion present in the suprapatellar space. Patellar tendon normal appearing on long and short views of the insertion and origin normal-appearing.  Medial meniscus:  Small vertical tear at the mid body of the medial meniscus with overlying fluid. MCL is normal-appearing.  Lateral meniscus: Normal-appearing LCL is also normal.  Pes anserine: Mild fluid with increased Doppler activity.

## 2012-04-10 NOTE — Patient Instructions (Addendum)
Thank you for coming in today. You have a mild medial meniscus injury.  This may heal, it may not.  Avoid any heavy deep knee bends or deep squats.  Avoid excessive deep jumping.  Wear the compression sleeve during and after exercise.  If this does not get better we can consider surgery however I am hopeful that we can avoid that.  Ibuprofen or tylenol is fine for pain. Please come back in 6 months or so.

## 2013-12-04 ENCOUNTER — Telehealth: Payer: Self-pay

## 2013-12-04 NOTE — Telephone Encounter (Signed)
LEFT PT VM IN REF TO NP APPT. °

## 2013-12-08 ENCOUNTER — Telehealth: Payer: Self-pay | Admitting: Internal Medicine

## 2013-12-08 NOTE — Telephone Encounter (Signed)
LEFT MESSAGE FOR PATIENT TO RETURN CALL TO SCHEDULE NP APPT.  °

## 2013-12-10 ENCOUNTER — Telehealth: Payer: Self-pay | Admitting: Internal Medicine

## 2013-12-10 ENCOUNTER — Telehealth: Payer: Self-pay

## 2013-12-10 NOTE — Telephone Encounter (Signed)
S/W PT IN REF TO NP APPT. ON 12/30/13@1 :30 REFERRING DR WHITE  DX-LEUKOPENIA MAILED NP PACKET

## 2013-12-10 NOTE — Telephone Encounter (Signed)
C/D 12/10/13 for appt. 12/30/13

## 2013-12-30 ENCOUNTER — Ambulatory Visit (HOSPITAL_BASED_OUTPATIENT_CLINIC_OR_DEPARTMENT_OTHER): Payer: 59 | Admitting: Internal Medicine

## 2013-12-30 ENCOUNTER — Ambulatory Visit: Payer: 59

## 2013-12-30 ENCOUNTER — Other Ambulatory Visit (HOSPITAL_BASED_OUTPATIENT_CLINIC_OR_DEPARTMENT_OTHER): Payer: 59

## 2013-12-30 ENCOUNTER — Encounter: Payer: Self-pay | Admitting: Internal Medicine

## 2013-12-30 ENCOUNTER — Other Ambulatory Visit: Payer: Self-pay | Admitting: Internal Medicine

## 2013-12-30 VITALS — BP 119/47 | HR 67 | Temp 97.8°F | Resp 18 | Ht 66.0 in | Wt 115.9 lb

## 2013-12-30 DIAGNOSIS — D72819 Decreased white blood cell count, unspecified: Secondary | ICD-10-CM | POA: Insufficient documentation

## 2013-12-30 DIAGNOSIS — E039 Hypothyroidism, unspecified: Secondary | ICD-10-CM | POA: Insufficient documentation

## 2013-12-30 DIAGNOSIS — D708 Other neutropenia: Secondary | ICD-10-CM

## 2013-12-30 LAB — COMPREHENSIVE METABOLIC PANEL (CC13)
ALT: 35 U/L (ref 0–55)
ANION GAP: 8 meq/L (ref 3–11)
AST: 31 U/L (ref 5–34)
Albumin: 4.1 g/dL (ref 3.5–5.0)
Alkaline Phosphatase: 64 U/L (ref 40–150)
BILIRUBIN TOTAL: 0.53 mg/dL (ref 0.20–1.20)
BUN: 15.1 mg/dL (ref 7.0–26.0)
CO2: 28 mEq/L (ref 22–29)
CREATININE: 0.8 mg/dL (ref 0.6–1.1)
Calcium: 9.5 mg/dL (ref 8.4–10.4)
Chloride: 103 mEq/L (ref 98–109)
GLUCOSE: 115 mg/dL (ref 70–140)
Potassium: 4.2 mEq/L (ref 3.5–5.1)
Sodium: 139 mEq/L (ref 136–145)
TOTAL PROTEIN: 6.9 g/dL (ref 6.4–8.3)

## 2013-12-30 LAB — CBC WITH DIFFERENTIAL/PLATELET
BASO%: 0.9 % (ref 0.0–2.0)
Basophils Absolute: 0 10*3/uL (ref 0.0–0.1)
EOS%: 4 % (ref 0.0–7.0)
Eosinophils Absolute: 0.1 10*3/uL (ref 0.0–0.5)
HEMATOCRIT: 40.8 % (ref 34.8–46.6)
HEMOGLOBIN: 13.5 g/dL (ref 11.6–15.9)
LYMPH#: 1.1 10*3/uL (ref 0.9–3.3)
LYMPH%: 35.6 % (ref 14.0–49.7)
MCH: 31.7 pg (ref 25.1–34.0)
MCHC: 33.1 g/dL (ref 31.5–36.0)
MCV: 95.8 fL (ref 79.5–101.0)
MONO#: 0.3 10*3/uL (ref 0.1–0.9)
MONO%: 9.7 % (ref 0.0–14.0)
NEUT%: 49.8 % (ref 38.4–76.8)
NEUTROS ABS: 1.5 10*3/uL (ref 1.5–6.5)
Platelets: 303 10*3/uL (ref 145–400)
RBC: 4.26 10*6/uL (ref 3.70–5.45)
RDW: 12.8 % (ref 11.2–14.5)
WBC: 3 10*3/uL — ABNORMAL LOW (ref 3.9–10.3)

## 2013-12-30 LAB — LACTATE DEHYDROGENASE (CC13): LDH: 137 U/L (ref 125–245)

## 2013-12-30 LAB — CHCC SMEAR

## 2013-12-30 NOTE — Progress Notes (Signed)
Checked in new pt with no financial concerns. °

## 2013-12-30 NOTE — Patient Instructions (Signed)
Leukopenia Leukopenia is a condition in which you have a low number of white blood cells. White blood cells help your body fight infections. The number of white blood cells in the body varies from person to person. Leukopenia is usually defined as having fewer than 4,000 white blood cells in 1 microliter of blood. There are five types of white blood cells. Two types make up most of your white blood cell count. These are neutrophils and lymphocytes. When your level of neutrophils is low, it is called neutropenia. When your lymphocytes are low, it is called lymphocytopenia. Neutropenia is the most dangerous type of leukopenia because it can lead to dangerous infections. CAUSES  Most white blood cells are made in the soft tissue inside your bones (bone marrow). Conditions that damage or suppress bone marrow are the most common causes of leukopenia. These include:  Medicine or X-ray treatments for cancer.  Serious infections.  Cancer of the white blood cells (leukemia or myeloma).  Medicines, including antibiotics, cardiac drugs, steroids, and those used to treat rheumatoid arthritis. Leukopenia also happens when white blood cells are destroyed after leaving your bone marrow. Causes may include:  Liver disease.  Diseases of the immune system (autoimmune disease).  Vitamin B deficiencies. SIGNS AND SYMPTOMS One of the most common signs of leukopenia, especially severe neutropenia, is having a lot of bacterial infections. Different infections have different symptoms. An infection in your lungs may cause coughing. A urinary tract infection may cause frequent urination and a burning sensation. You may also get infections of the blood, skin, rectum, throat, sinus, or ear. General signs and symptoms of leukopenia include:  Fever.  Fatigue.  Swollen glands (lymph nodes).  Painful mouth ulcers.  Gum disease. DIAGNOSIS  Your health care provider can diagnose leukopenia based on a physical exam  and the results of lab tests. During a physical exam, your health care provider will feel for swollen lymph nodes and check whether your spleen is enlarged. Your spleen is an organ on the left side of your body that stores white blood cells. Tests that may be done include:  A complete blood count. This blood test counts each type of white cell.  Bone marrow aspiration. Some bone marrow is removed to be checked under a microscope.  Lymph node biopsy. Some lymph node tissue is removed to be checked under a microscope.  Other types of blood tests or imaging tests. TREATMENT  Treatment of leukopenia depends on the cause. Some common treatments include:  Antibiotics for bacterial infections.  No longer taking medicines that may cause leukopenia.  Vitamin B supplements.  Medicines to stimulate neutrophil production (hematopoietic growth factors) for neutropenia. HOME CARE INSTRUCTIONS  Preventing infection is important if you have leukopenia.  Avoid sick friends and family members.  Wash your hands often.  Do noteat uncooked or undercooked meats.  Wash fruits and vegetables.  Do not eat or drink unpasteurized dairy products.  Get regular dental care, and maintain good dental hygiene.  Keep all follow-up appointments. SEEK MEDICAL CARE IF:  You have chills or a fever.  You have signs or symptoms of infection. SEEK IMMEDIATE MEDICAL CARE IF:  You have a fever or persistent symptoms for more than 2-3 days.  You have trouble breathing.  You have chest pain. MAKE SURE YOU:  Understand these instructions.  Will watch your condition.  Will get help right away if you are not doing well or get worse. Document Released: 07/07/2013 Document Reviewed: 05/26/2013 [employer] Patient Information  2015 [employer]. This information is not intended to replace advice given to you by your health care provider. Make sure you discuss any questions you have with your health care  provider.

## 2013-12-30 NOTE — Progress Notes (Signed)
[place] [phone]   [phone]  NEW PATIENT EVALUATION   Name: ARORA COAKLEY Date: 12/30/2013 MRN: [id number] DOB: May 06, 1956  PCP: Vidal Schwalbe, MD   REFERRING PHYSICIAN: Vidal Schwalbe, MD  REASON FOR REFERRAL: Chronic Leukopenia   HISTORY OF PRESENT ILLNESS:Lil L Cotten is a 58 y.o. female who is being referred to our office for leukopenia.  She was seen by Dr. Dema Severin on Nov 25, 2013.  She notes that she has a longstanding history of leukopenia for at least 10 years.  She reports that her sister and dad (now deceased from [place] and also had laryngeal cancer (non-smoker) s/p surgery) had leukopenia as well.  She denies recurrent infections and requirements for antibiotics. She denies hospitalization other than for her recent surgeries.  About 10 years ago, she reports a WBC of 2.1.   Her labs on 11/20/2013 was 2.1; Hgb 13.3; MCV 94; plts 287.  Her comprehensive metabolic panel was within normal limits.   Her prior CBC on 04/25/ 2014 showed a WBC of 2.5; on 08/17/2011, WBC was 2.1.  She denies bleeding episodes.  She reports having a screening colonoscopy at age 41 without removal of polyps (done at [facility]). Her father and sister had precancerous polyps.   She has had a mammogram about 8 months ago without evidence of malignancy.  She denies any history of breast biopsies. Her last menses was in 2011.    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  She reports adequate activity and exercises daily with spin classes.     PAST MEDICAL HISTORY:  has a past medical history of Hypothyroidism.     PAST SURGICAL HISTORY: Past Surgical History  Procedure Laterality Date  . Shoulder surgery Right 1997    torn labrium  . Dilation and curettage of uterus       CURRENT MEDICATIONS: has a current medication list which includes the following prescription(s): levothyroxine.   ALLERGIES: Review of patient's allergies  indicates no known allergies.   SOCIAL HISTORY:  reports that she has never smoked. She does not have any smokeless tobacco history on file. She reports that she drinks about 1.2 ounces of alcohol per week.   FAMILY HISTORY: family history includes Cancer in her father; Hypercholesterolemia in her mother; Hypertension in her mother.   LABORATORY DATA:  Results for orders placed in visit on 12/30/13 (from the past 48 hour(s))  CBC WITH DIFFERENTIAL     Status: Abnormal   Collection Time    12/30/13  1:40 PM      Result Value Ref Range   WBC 3.0 (*) 3.9 - 10.3 10e3/uL   NEUT# 1.5  1.5 - 6.5 10e3/uL   HGB 13.5  11.6 - 15.9 g/dL   HCT 40.8  34.8 - 46.6 %   Platelets 303  145 - 400 10e3/uL   MCV 95.8  79.5 - 101.0 fL   MCH 31.7  25.1 - 34.0 pg   MCHC 33.1  31.5 - 36.0 g/dL   RBC 4.26  3.70 - 5.45 10e6/uL   RDW 12.8  11.2 - 14.5 %   lymph# 1.1  0.9 - 3.3 10e3/uL   MONO# 0.3  0.1 - 0.9 10e3/uL   Eosinophils Absolute 0.1  0.0 - 0.5 10e3/uL   Basophils Absolute 0.0  0.0 - 0.1 10e3/uL   NEUT% 49.8  38.4 - 76.8 %   LYMPH% 35.6  14.0 - 49.7 %  MONO% 9.7  0.0 - 14.0 %   EOS% 4.0  0.0 - 7.0 %   BASO% 0.9  0.0 - 2.0 %  [facility] SMEAR     Status: None   Collection Time    12/30/13  1:40 PM      Result Value Ref Range   Smear Result Smear Available    COMPREHENSIVE METABOLIC PANEL (JJ88)     Status: None   Collection Time    12/30/13  1:40 PM      Result Value Ref Range   Sodium 139  136 - 145 mEq/L   Potassium 4.2  3.5 - 5.1 mEq/L   Chloride 103  98 - 109 mEq/L   CO2 28  22 - 29 mEq/L   Glucose 115  70 - 140 mg/dl   BUN 15.1  7.0 - 26.0 mg/dL   Creatinine 0.8  0.6 - 1.1 mg/dL   Total Bilirubin 0.53  0.20 - 1.20 mg/dL   Alkaline Phosphatase 64  40 - 150 U/L   AST 31  5 - 34 U/L   ALT 35  0 - 55 U/L   Total Protein 6.9  6.4 - 8.3 g/dL   Albumin 4.1  3.5 - 5.0 g/dL   Calcium 9.5  8.4 - 10.4 mg/dL   Anion Gap 8  3 - 11 mEq/L  LACTATE DEHYDROGENASE (CC13)     Status: None    Collection Time    12/30/13  1:40 PM      Result Value Ref Range   LDH 137  125 - 245 U/L       RADIOGRAPHY: No results found.     REVIEW OF SYSTEMS:  Constitutional: Denies fevers, chills or abnormal weight loss Eyes: Denies blurriness of vision Ears, nose, mouth, throat, and face: Denies mucositis or sore throat Respiratory: Denies cough, dyspnea or wheezes Cardiovascular: Denies palpitation, chest discomfort or lower extremity swelling Gastrointestinal:  Denies nausea, heartburn or change in bowel habits Skin: Denies abnormal skin rashes Lymphatics: Denies new lymphadenopathy or easy bruising Neurological:Denies numbness, tingling or new weaknesses Behavioral/Psych: Mood is stable, no new changes  All other systems were reviewed with the patient and are negative.  PHYSICAL EXAM:  height is $RemoveB'5\' 6"'qYBaraUL$  (1.676 m) and weight is 115 lb 14.4 oz (52.572 kg). Her oral temperature is 97.8 F (36.6 C). Her blood pressure is 119/47 and her pulse is 67. Her respiration is 18 and oxygen saturation is 100%.    GENERAL:alert, no distress and comfortable; well developed and well nourished and thin.  SKIN: skin color, texture, turgor are normal, no rashes or significant lesions EYES: normal, Conjunctiva are pink and non-injected, sclera clear OROPHARYNX:no exudate, no erythema and lips, buccal mucosa, and tongue normal  NECK: supple, thyroid normal size, non-tender, without nodularity LYMPH:  no palpable lymphadenopathy in the cervical, axillary or inguinal LUNGS: clear to auscultation and percussion with normal breathing effort HEART: regular rate & rhythm and no murmurs and no lower extremity edema ABDOMEN:abdomen soft, non-tender and normal bowel sounds Musculoskeletal:no cyanosis of digits and no clubbing  NEURO: alert & oriented x 3 with fluent speech, no focal motor/sensory deficits   IMPRESSION: NYLEAH MCGINNIS is a 58 y.o. female with a history of    PLAN:  1.  Leukopenia,  chronic. --Leukopenia, mild with an ANC of 1500 without recurrent infections.  Differential includes Benign ethnic neutropenia (prevalence of neutropenia in asymptomatic patients studied in a group of 261 healthy of varying ethnicities living in  [place] for [place] American was 10.5% in one study). This is consistent based on chronicity with her neutropenia over a 10 year span without infections. Other considerations include vitamin b12 deficiency versus autoimmune versus a primary bone marrow with diminished function or cyclic neutropenia. Infections or HIV-induced or medication induced is felt to be less likely based on her history. Screening for collagen/vascular disease by ANA, anti-DNA antibody, C3,C4 and urine protein/urine creatinine can be considered with the presence Of symptoms. A bone marrow biopsy and aspirate can be considered to assess her neutrophil reserve (i.e., granulocyte abundance, maturation and morphology) should she developed symptoms of infection due to her neutropenia.  -- RTC in 12 months or as needed based on the presence of symptoms.  She will continue close follow up with Dr. Dema Severin.   She was provided a handout on leukopenia.   2. Hypothyroidism. --Continue her synthroid.   All questions were answered. The patient knows to call the clinic with any problems, questions or concerns. We can certainly see the patient much sooner if necessary.  I spent 25 minutes counseling the patient face to face. The total time spent in the appointment was 45 minutes.    CHISM, DAVID, MD 12/30/2013 2:49 PM

## 2015-01-10 ENCOUNTER — Other Ambulatory Visit (HOSPITAL_COMMUNITY)
Admission: RE | Admit: 2015-01-10 | Discharge: 2015-01-10 | Disposition: A | Discharge status: Home / Self Care | Payer: 59 | Source: Ambulatory Visit | Attending: Family Medicine | Admitting: Family Medicine

## 2015-01-10 ENCOUNTER — Other Ambulatory Visit: Payer: Self-pay | Admitting: Family Medicine

## 2015-01-10 DIAGNOSIS — Z01419 Encounter for gynecological examination (general) (routine) without abnormal findings: Secondary | ICD-10-CM | POA: Diagnosis present

## 2015-01-13 LAB — CYTOLOGY - PAP

## 2016-02-21 ENCOUNTER — Encounter: Payer: Self-pay | Admitting: Sports Medicine

## 2016-02-21 ENCOUNTER — Ambulatory Visit (INDEPENDENT_AMBULATORY_CARE_PROVIDER_SITE_OTHER): Payer: 59 | Admitting: Sports Medicine

## 2016-02-21 DIAGNOSIS — M19011 Primary osteoarthritis, right shoulder: Secondary | ICD-10-CM | POA: Insufficient documentation

## 2016-02-21 DIAGNOSIS — M75111 Incomplete rotator cuff tear or rupture of right shoulder, not specified as traumatic: Secondary | ICD-10-CM | POA: Diagnosis not present

## 2016-02-21 MED ORDER — NITROGLYCERIN 0.2 MG/HR TD PT24: MEDICATED_PATCH | TRANSDERMAL | 1 refills | Status: DC

## 2016-02-21 NOTE — Patient Instructions (Signed)
Perform the rotator cuff exercises as directed  Nitroglycerin Protocol   Apply 1/4 nitroglycerin patch to affected area daily.  Change position of patch within the affected area every 24 hours.  You may experience a headache during the first 1-2 weeks of using the patch, these should subside.  If you experience headaches after beginning nitroglycerin patch treatment, you may take your preferred over the counter pain reliever.  Another side effect of the nitroglycerin patch is skin irritation or rash related to patch adhesive.  Please notify our office if you develop more severe headaches or rash, and stop the patch.  Tendon healing with nitroglycerin patch may require 12 to 24 weeks depending on the extent of injury.  Men should not use if taking Viagra, Cialis, or Levitra.   Do not use if you have migraines or rosacea.

## 2016-02-21 NOTE — Assessment & Plan Note (Addendum)
A: 2 weeks of pain due to an anterior partial thickness supraspinatus tear, demonstrated on ultrasound today. This is most likely from hyperextension swinging overhead during volleyball. Some calcifications noted suggesting chronic or subacute inflammation.  P: Topical nitroglycerin patch over area daily Instructions provided for daily shoulder exercises Follow up at clinic in 4-6 weeks

## 2016-02-21 NOTE — Progress Notes (Signed)
   HPI  CC: Right shoulder pain for 2 weeks  Anita Odonnell is a 60 y/o woman with workup of a mild leukopenia of unknown significance last year, otherwise no significant medical history, who has had worsening right shoulder pain associated with playing volleyball over the past 2 weeks. She does not remember an acute onset or injury. Her pain worsens when playing volleyball or lifting weights overhead. There is no significant pain at rest and she has not noticed swelling or discoloration.  ROS She has not noticed any pain, numbness, or weakness in her arm. Elbow is now good with no other joint swelling  Medications/Interventions Tried: Ginnie Smart  Objective: BP 113/68   Ht 5\' 6"  (1.676 m)   Wt 118 lb (53.5 kg)   BMI 19.05 kg/m  Very healthy appearing woman with good musculature.  Shoulder: Inspection reveals no abnormalities, atrophy or asymmetry. Palpation is normal with no tenderness over AC joint or bicipital groove. ROM is full in all planes. Mild pain in abduction within first 20 degrees. Rotator cuff strength somewhat weak on ER and on elevation perhaps 2/2 pain Mild impingement with positive Hawkin's tests, empty can. Speeds and Yergason's tests normal. Normal scapular function observed. No painful arc and no drop arm sign.   R shoulder ultrasound: Partial thickness tear in anterior portion of supraspinatus tendon. Calcification was present. There was minimal blood flow on doppler US. Mild compression under acromion joint visualized on ROM. Biceps, infraspinatus, teres minor, and subscapularis tendons were all visualized well without abnormalities. AC joint normal  Assessment and plan:  Partial tear of right rotator cuff A: 2 weeks of pain due to an anterior partial thickness supraspinatus tear, demonstrated on ultrasound today. This is most likely from hyperextension swinging overhead during volleyball. Some calcifications noted suggesting chronic or subacute  inflammation.  P: Topical nitroglycerin patch over area daily Instructions provided for daily shoulder exercises Follow up at clinic in 4-6 weeks   No orders of the defined types were placed in this encounter.   Meds ordered this encounter  Medications  . DISCONTD: nitroGLYCERIN (MINITRAN) 0.2 mg/hr patch    Sig: Place 1/4 patch to affected area daily    Dispense:  30 patch    Refill:  1  . nitroGLYCERIN (MINITRAN) 0.2 mg/hr patch    Sig: Place 1/4 patch to affected area daily    Dispense:  30 patch    Refill:  Balta, MD PGY-II Internal Medicine Resident  Agree with assessment and plan.  KB Fields< MS

## 2016-03-20 ENCOUNTER — Other Ambulatory Visit: Payer: 59 | Admitting: Sports Medicine

## 2016-03-27 ENCOUNTER — Other Ambulatory Visit: Payer: Self-pay

## 2016-03-27 ENCOUNTER — Ambulatory Visit (INDEPENDENT_AMBULATORY_CARE_PROVIDER_SITE_OTHER): Payer: 59 | Admitting: Sports Medicine

## 2016-03-27 ENCOUNTER — Encounter: Payer: Self-pay | Admitting: Sports Medicine

## 2016-03-27 VITALS — BP 102/62 | Ht 66.0 in | Wt 118.0 lb

## 2016-03-27 DIAGNOSIS — M75111 Incomplete rotator cuff tear or rupture of right shoulder, not specified as traumatic: Secondary | ICD-10-CM

## 2016-03-27 NOTE — Progress Notes (Signed)
CC: followup of right rotator cuff partial tear  Patient returns for followup of a partial right rotator cuff tear She has been compliant with home exercises and does them twice daily She feels her strength has improved significantly She feels her pain is 80% resolved We have kept her out of doing anything of her hand in volleyball Activities of daily living are not painful at this time  Review of systems No nighttime pain No neck pain No radicular symptoms into the right shoulder or arm  Physical examination Thin muscular female in no acute distress BP 102/62   Ht 5\' 6"  (1.676 m)   Wt 118 lb (53.5 kg)   BMI 19.05 kg/m   Shoulder: Inspection reveals no abnormalities, atrophy or asymmetry. Palpation is normal with no tenderness over AC joint or bicipital groove. ROM is full in all planes. Rotator cuff strength normal throughout. No signs of impingement with negative  Hawkin's tests, empty can. Only mild pain with the empty can today Speeds and Yergason's tests normal. No labral pathology noted with negative Obrien's, negative clunk and good stability. Normal scapular function observed. No painful arc and no drop arm sign. No apprehension sign  Ultrasound of Shoulder-RT  BT short-nl BT long-nl Supraspinatus tendon- a small distal tear has almost completely resolved with only a hypoechoic area of 3 mm. There is calcification in the tendon that is less prominent than before. There is greater muscle bulk in the tendon. Subscapularis tendon-nl Infraspinatus tendon-nl Teres Minor tendon-nl AC joint: NL  Summary and Additional findings- healing partial tear of the supraspinatous tendon with less hypoechoic change and less calcification

## 2016-03-27 NOTE — Assessment & Plan Note (Signed)
This is much improved I increased the intensity of the rotator cuff exercises I added more scapular exercises  Recheck in 6 weeks with a repeat scan  Continue nitroglycerin protocol

## 2016-05-08 ENCOUNTER — Encounter: Payer: Self-pay | Admitting: Sports Medicine

## 2016-05-08 ENCOUNTER — Ambulatory Visit: Payer: Self-pay

## 2016-05-08 ENCOUNTER — Ambulatory Visit (INDEPENDENT_AMBULATORY_CARE_PROVIDER_SITE_OTHER): Payer: 59 | Admitting: Sports Medicine

## 2016-05-08 VITALS — BP 108/70 | Ht 66.0 in | Wt 118.0 lb

## 2016-05-08 DIAGNOSIS — M75111 Incomplete rotator cuff tear or rupture of right shoulder, not specified as traumatic: Secondary | ICD-10-CM | POA: Diagnosis not present

## 2016-05-08 NOTE — Progress Notes (Signed)
Followup: Partial tear right supraspinatous muscle  Patient plays active volleyball Seen initially on August 8 with a partial supraspinatous tear Doing home exercises Doing nitroglycerin protocol 80% better at 5 week followup  Now she is at 11 week followup and is able to do the exercises fully overhead 3 pounds Pain has been much improved Last night she did get shoulder pain but think she rolled over on her shoulder in a funny position  Social history Nonsmoker  Review of systems No neck pain No radicular symptoms  Physical exam Pleasant, thin but muscular female in no acute distress BP 108/70   Ht 5\' 6"  (1.676 m)   Wt 118 lb (53.5 kg)   BMI 19.05 kg/m   RT Shoulder: Inspection reveals no abnormalities, atrophy or asymmetry. Palpation is normal with no tenderness over AC joint or bicipital groove. ROM is full in all planes. Rotator cuff strength normal throughout. No signs of impingement with negative Neer and Hawkin's tests, empty can. Speeds and Yergason's tests normal. No labral pathology noted with negative Obrien's, negative clunk and good stability. Normal scapular function observed. No painful arc and no drop arm sign. No apprehension sign  Exam today shows complete resolution of the weakness and positive impingement found the floor  Ultrasound of Shoulder- RT  BT short- Norm BT long-NORM Supraspinatus tendon- The tendon has increased in volume. There is a calcification in the area of prior tear. There is very slight hypoechoic change around the calcification. No other significant tear or retraction is noted Subscapularis tendon-Norm Infraspinatus tendon-Norm Teres Minor tendon-Norm AC joint - Norm  Summary and Additional findings- She continues to show interval improvement in her ultrasound findings as well as her clinical exam. Today there is no significant supraspinatous tear detected. There is some residual calcification and a very slight hypoechoic  change around the calcification.  Ultrasound performed and interpreted by Leane Call.D.

## 2016-05-08 NOTE — Assessment & Plan Note (Signed)
She has made excellent progress with her recovery Based on physical exam and ultrasound findings she seems to be about 90% healed  Over the next month she will gradually increase her activities She will return playing volleyball  She will continue on the nitroglycerin  If pain does not return she can see [place] when necessary  If still painful we will recheck her in 2 months

## 2017-11-14 ENCOUNTER — Encounter: Payer: Self-pay | Admitting: Sports Medicine

## 2017-11-14 ENCOUNTER — Ambulatory Visit (INDEPENDENT_AMBULATORY_CARE_PROVIDER_SITE_OTHER): Payer: Managed Care, Other (non HMO) | Admitting: Sports Medicine

## 2017-11-14 ENCOUNTER — Ambulatory Visit: Payer: Self-pay

## 2017-11-14 VITALS — BP 92/64 | Ht 66.0 in | Wt 118.0 lb

## 2017-11-14 DIAGNOSIS — M67911 Unspecified disorder of synovium and tendon, right shoulder: Secondary | ICD-10-CM | POA: Diagnosis not present

## 2017-11-14 DIAGNOSIS — M75111 Incomplete rotator cuff tear or rupture of right shoulder, not specified as traumatic: Secondary | ICD-10-CM

## 2017-11-14 NOTE — Patient Instructions (Signed)
Try the exercises that Dr. Oneida Alar discussed.  Try Arnica gel which is supposed to stimulate blood flow  If the pain is persistent and worse and if you notice weakness or night time pain, please follow up in clinic

## 2017-11-14 NOTE — Progress Notes (Signed)
      Date of Visit: 11/14/2017   HPI:  Right Shoulder Pain:  - patient has a history of partial tear of the right rotator cuff in 03/2016. At that time she used nitroglycerin patch which did help some but had headaches. She also was given exercises. Her tear did heal completely and calcify.  - today she reports of 6 week history of right anterior shoulder pain. She does not have pain at rest. It is mainly when she hits the volleyball over head and when she does bench presses. Denies any weakness, numbness, or tingling. No pain with her ADLs and no night time pain.   ROS: See HPI.  [place]:  PMH: Hx of tear of the R rotator Cuff  Hypothyroidism  PHYSICAL EXAM: BP 92/64   Ht 5\' 6"  (1.676 m)   Wt 118 lb (53.5 kg)   BMI 19.05 kg/m  Shoulder:  Right  Inspection reveals no abnormalities, atrophy or asymmetry. Palpation is normal with no tenderness over AC joint or bicipital groove. ROM is full in all planes. Rotator cuff strength normal throughout. Signs of impingement with positive Hawkin's tests and empty can. Normal scapular function observed. No painful arc and no drop arm sign. No apprehension sign  Ultrasound of Shoulder- RT  BT short- Norm BT long-NORM Supraspinatus tendon-  There is a calcification in the area of prior tear.  No other significant tear or retraction is noted Subscapularis tendon-Norm Infraspinatus tendon-Norm Teres Minor tendon-Norm AC joint - Norm  Impression;  Now with some calcific change in supraspinatus tendon but no tear or active tendinopathy  Ultrasound and interpretation by Wolfgang Phoenix. Fields, MD   ASSESSMENT/PLAN:  1. Tendinopathy of rotator cuff, right Symptoms consistent with rotator cuff tendinopathy likely drom the calcification from her prior tear. No new tears noted. Discussed doing light weight shoulder exercises and trying arnica gel since she did have side effects from nitroglycerin patch. Patient should return to clinic if symptoms  worsen and especially if having weakness or night time pain.   Smiley Houseman, MD PGY 3 [facility]  I observed and examined the patient with the resident and agree with assessment and plan.  Note reviewed and modified by me. Stefanie Libel, MD

## 2018-07-03 ENCOUNTER — Ambulatory Visit (INDEPENDENT_AMBULATORY_CARE_PROVIDER_SITE_OTHER): Payer: Managed Care, Other (non HMO) | Admitting: Sports Medicine

## 2018-07-03 ENCOUNTER — Encounter: Payer: Self-pay | Admitting: Sports Medicine

## 2018-07-03 VITALS — BP 100/72 | Ht 66.0 in | Wt 115.0 lb

## 2018-07-03 DIAGNOSIS — M25511 Pain in right shoulder: Secondary | ICD-10-CM | POA: Diagnosis not present

## 2018-07-03 DIAGNOSIS — S43431A Superior glenoid labrum lesion of right shoulder, initial encounter: Secondary | ICD-10-CM | POA: Diagnosis not present

## 2018-07-03 NOTE — Patient Instructions (Signed)
You have a labral injury. We recommend exercises to help strengthen that joint. Exercises include: 1. "Flys" with external rotation: elbows bent and shoulders up to 90 degrees; rotate in and out 2. Internal rotation: with elbows bent at 90 degrees, rotate forearm out and in; make sure not to cross midline of body 3.  Palm up, straight arm raise; keep arms at an angle instead of straight in front to prevent it from hurting; life to 90 degrees  Do 3 sets of 15 daily for 6 weeks.  He will also get an x-ray of that joint today and we will call you with results.  You can continue to apply ice or heat symptom relief.  Call our office with any questions or concerns

## 2018-07-03 NOTE — Progress Notes (Signed)
  Anita Odonnell - 62 y.o. female MRN [id number]  Date of birth: 01-16-1956    SUBJECTIVE:      Chief Complaint:/ HPI:  Anita Odonnell is a 62 year old female who presents for reevaluation of right shoulder pain. In August 2017 she was diagnosed with partial tear of right rotator cuff at supraspinatous. She did about 6 to 8 weeks of exercises with resolution of pain. Today she presents with new onset pain for about 3 weeks. Denies recent injury or fall.  Pain more localized to the anterior portion, but does report some pain posteriorly as well.  Pain is intermittent and sharp in nature but reports some dullness at rest. Pain is worsened with exercise, volleyball and abduction of shoulder. Does not affect basic ADL. Denies numbness or weakness. Only wakes up from sleep in pain if she rolls over on affected shoulder. Has tried ibuprofen intermittently with some relief. Tried nitroglycerin patch in the past but did not tolerate 2/2 headaches.  ROS:     See HPI Denies radicular sxs No significant neck pain  PERTINENT  PMH / PSH FH / / SH:  Past Medical, Surgical, Social, and Family History Reviewed & Updated in the EMR.  Pertinent findings include:  PMH: Partial tear of right rotator cuff; hypothyroidism  OBJECTIVE: BP 100/72   Ht 5\' 6"  (1.676 m)   Wt 52.2 kg   BMI 18.56 kg/m   Physical Exam:  Vital signs are reviewed.  GEN: Alert and oriented, NAD Pulm: Breathing unlabored PSY: normal mood, congruent affect  MSK: No obvious deformity or step offs Full active and passive ROM of bilateral shoulders and elbows; pain with shoulder abduction on right 5/5 strength No tenderness on palpation of joint or bicipital groove Negative drop arm test + Empty can on right for pain but not weakness Strong IR and ER + painful arc Negative Neer's on right  Negative Hawkins on right + O'Briens on right  + Apprehension on right + Crossover test on right Negative Lift off test on right No scapular  winging  ASSESSMENT & PLAN:  1. Right labral tear: Symptoms concerning for injury of right labrum secondary to chronic overuse playing volleyball.  She continues to show good strength and stability exam.  Will obtain x-ray further evaluate joint.  - Continue with heat or ice for symptom relief. Provided recommended exercises to do daily for next 6 weeks to help strengthen joint. Will call with x-ray results and follow-up in clinic in 6 weeks.   Exercises will focus on Pec Major and IR/ biceps strength  Need to assess for [facility] DJD  If not better with this strategy repeat US on RTC  Anita Reynolds, DO [facility]- PGY1  I observed and examined the patient with the resident and agree with assessment and plan.  Note reviewed and modified by me. Stefanie Libel, MD

## 2018-07-04 ENCOUNTER — Ambulatory Visit
Admission: RE | Admit: 2018-07-04 | Discharge: 2018-07-04 | Disposition: A | Discharge status: Home / Self Care | Payer: Managed Care, Other (non HMO) | Source: Ambulatory Visit | Attending: Sports Medicine | Admitting: Sports Medicine

## 2018-07-04 DIAGNOSIS — M25511 Pain in right shoulder: Secondary | ICD-10-CM

## 2019-11-03 ENCOUNTER — Ambulatory Visit: Payer: Self-pay

## 2019-11-03 ENCOUNTER — Encounter: Payer: Self-pay | Admitting: Family Medicine

## 2019-11-03 ENCOUNTER — Ambulatory Visit (INDEPENDENT_AMBULATORY_CARE_PROVIDER_SITE_OTHER): Payer: Managed Care, Other (non HMO) | Admitting: Family Medicine

## 2019-11-03 ENCOUNTER — Other Ambulatory Visit: Payer: Self-pay

## 2019-11-03 VITALS — BP 135/86 | HR 60 | Ht 66.0 in | Wt 118.0 lb

## 2019-11-03 DIAGNOSIS — S86811A Strain of other muscle(s) and tendon(s) at lower leg level, right leg, initial encounter: Secondary | ICD-10-CM

## 2019-11-03 DIAGNOSIS — M79661 Pain in right lower leg: Secondary | ICD-10-CM

## 2019-11-03 DIAGNOSIS — S86819A Strain of other muscle(s) and tendon(s) at lower leg level, unspecified leg, initial encounter: Secondary | ICD-10-CM | POA: Insufficient documentation

## 2019-11-03 NOTE — Assessment & Plan Note (Signed)
Changes occurring near the proximal head of the medial gastrocnemius.  Anita Odonnell is mild in nature. -Counseled on home exercise therapy and supportive care. -Counseled on compression. -Could consider physical therapy if needed.

## 2019-11-03 NOTE — Progress Notes (Signed)
Anita Odonnell - 64 y.o. female MRN [id number]  Date of birth: 1955-10-16  SUBJECTIVE:  Including CC & ROS.  Chief Complaint  Patient presents with  . Leg Injury    right calf x 11/01/2019    Anita Odonnell is a 64 y.o. female that is presenting with right medial gastrocnemius pain.  She was playing volleyball and felt a pain in the medial head of the gastrocnemius.  Seem to aggravate it on Sunday when she is playing volleyball.  No history of surgery or similar pain.  It is worse when her leg is in full extension.   Review of Systems See HPI   HISTORY: Past Medical, Surgical, Social, and Family History Reviewed & Updated per EMR.   Pertinent Historical Findings include:  Past Medical History:  Diagnosis Date  . Hypothyroidism     Past Surgical History:  Procedure Laterality Date  . DILATION AND CURETTAGE OF UTERUS    . SHOULDER SURGERY Right 1997   torn labrium    Family History  Problem Relation Age of Onset  . Hypertension Mother   . Hypercholesterolemia Mother   . Cancer Father        MDS    Social History   Socioeconomic History  . Marital status: Married    Spouse name: Not on file  . Number of children: Not on file  . Years of education: Not on file  . Highest education level: Not on file  Occupational History  . Not on file  Tobacco Use  . Smoking status: Never Smoker  . Smokeless tobacco: Never Used  Substance and Sexual Activity  . Alcohol use: Yes    Alcohol/week: 2.0 standard drinks    Types: 2 Glasses of wine per week  . Drug use: Not on file  . Sexual activity: Not on file  Other Topics Concern  . Not on file  Social History Narrative  . Not on file   Social Determinants of Health   Financial Resource Strain:   . Difficulty of Paying Living Expenses:   Food Insecurity:   . Worried About Charity fundraiser in the Last Year:   . Arboriculturist in the Last Year:   Transportation Needs:   . Film/video editor (Medical):   Marland Kitchen Lack  of Transportation (Non-Medical):   Physical Activity:   . Days of Exercise per Week:   . Minutes of Exercise per Session:   Stress:   . Feeling of Stress :   Social Connections:   . Frequency of Communication with Friends and Family:   . Frequency of Social Gatherings with Friends and Family:   . Attends Religious Services:   . Active Member of Clubs or Organizations:   . Attends Archivist Meetings:   Marland Kitchen Marital Status:   Intimate Partner Violence:   . Fear of Current or Ex-Partner:   . Emotionally Abused:   Marland Kitchen Physically Abused:   . Sexually Abused:      PHYSICAL EXAM:  VS: BP 135/86   Pulse 60   Ht 5\' 6"  (1.676 m)   Wt 118 lb (53.5 kg)   BMI 19.05 kg/m  Physical Exam Gen: NAD, alert, cooperative with exam, well-appearing MSK:  Right leg: Tenderness to palpation over the medial head of the gastrocnemius. Pain with 1 leg standing and going up on tiptoes with leg straight. No pain with plantarflexion and dorsiflexion. No knee effusion. No ecchymosis or obvious swelling. Neurovascularly intact  Limited  ultrasound: Right lower leg:  No changes at the musculotendinous junction of the Achilles in the medial gastrocnemius. No changes of the distal medial gastrocnemius. Near the medial head there is a change of the fascia layer and slight muscle hypoechoic changes to suggest a strain in this area. No changes in interventricular septum. No signs of Baker's cyst  Summary: Findings suggest medial gastrocnemius strain.  Ultrasound and interpretation by Clearance Coots, MD    ASSESSMENT & PLAN:   Strain of calf muscle Changes occurring near the proximal head of the medial gastrocnemius.  Dorthula Perfect is mild in nature. -Counseled on home exercise therapy and supportive care. -Counseled on compression. -Could consider physical therapy if needed.

## 2019-11-03 NOTE — Patient Instructions (Signed)
Nice to meet you Please try compression  Please try the exercises   Please send me a message in MyChart with any questions or updates.  Please see me back in 4 weeks .   --Dr. Raeford Razor

## 2019-11-04 ENCOUNTER — Ambulatory Visit: Payer: Managed Care, Other (non HMO) | Admitting: Sports Medicine

## 2019-11-05 ENCOUNTER — Encounter: Payer: Self-pay | Admitting: Family Medicine

## 2019-11-26 ENCOUNTER — Ambulatory Visit: Payer: Managed Care, Other (non HMO) | Admitting: Family Medicine

## 2019-12-01 ENCOUNTER — Other Ambulatory Visit: Payer: Self-pay

## 2019-12-01 ENCOUNTER — Ambulatory Visit: Payer: Self-pay

## 2019-12-01 ENCOUNTER — Ambulatory Visit (INDEPENDENT_AMBULATORY_CARE_PROVIDER_SITE_OTHER): Payer: Managed Care, Other (non HMO) | Admitting: Family Medicine

## 2019-12-01 ENCOUNTER — Encounter: Payer: Self-pay | Admitting: Family Medicine

## 2019-12-01 VITALS — BP 109/75 | HR 69 | Ht 66.0 in | Wt 118.0 lb

## 2019-12-01 DIAGNOSIS — S86811D Strain of other muscle(s) and tendon(s) at lower leg level, right leg, subsequent encounter: Secondary | ICD-10-CM

## 2019-12-01 NOTE — Progress Notes (Signed)
Anita Odonnell - 64 y.o. female MRN [id number]  Date of birth: 07/25/1955  SUBJECTIVE:  Including CC & ROS.  Chief Complaint  Patient presents with  . Follow-up    right calf    Anita Odonnell is a 64 y.o. female that is following up for her right calf strain.  Has been doing the home exercises but still notices the pain in certain instances.  Denies any ecchymosis.  Pain is still occurring over the proximal calf.   Review of Systems See HPI   HISTORY: Past Medical, Surgical, Social, and Family History Reviewed & Updated per EMR.   Pertinent Historical Findings include:  Past Medical History:  Diagnosis Date  . Hypothyroidism     Past Surgical History:  Procedure Laterality Date  . DILATION AND CURETTAGE OF UTERUS    . SHOULDER SURGERY Right 1997   torn labrium    Family History  Problem Relation Age of Onset  . Hypertension Mother   . Hypercholesterolemia Mother   . Cancer Father        MDS    Social History   Socioeconomic History  . Marital status: Married    Spouse name: Not on file  . Number of children: Not on file  . Years of education: Not on file  . Highest education level: Not on file  Occupational History  . Not on file  Tobacco Use  . Smoking status: Never Smoker  . Smokeless tobacco: Never Used  Substance and Sexual Activity  . Alcohol use: Yes    Alcohol/week: 2.0 standard drinks    Types: 2 Glasses of wine per week  . Drug use: Not on file  . Sexual activity: Not on file  Other Topics Concern  . Not on file  Social History Narrative  . Not on file   Social Determinants of Health   Financial Resource Strain:   . Difficulty of Paying Living Expenses:   Food Insecurity:   . Worried About Charity fundraiser in the Last Year:   . Arboriculturist in the Last Year:   Transportation Needs:   . Film/video editor (Medical):   Marland Kitchen Lack of Transportation (Non-Medical):   Physical Activity:   . Days of Exercise per Week:   . Minutes of  Exercise per Session:   Stress:   . Feeling of Stress :   Social Connections:   . Frequency of Communication with Friends and Family:   . Frequency of Social Gatherings with Friends and Family:   . Attends Religious Services:   . Active Member of Clubs or Organizations:   . Attends Archivist Meetings:   Marland Kitchen Marital Status:   Intimate Partner Violence:   . Fear of Current or Ex-Partner:   . Emotionally Abused:   Marland Kitchen Physically Abused:   . Sexually Abused:      PHYSICAL EXAM:  VS: BP 109/75   Pulse 69   Ht 5\' 6"  (1.676 m)   Wt 118 lb (53.5 kg)   BMI 19.05 kg/m  Physical Exam Gen: NAD, alert, cooperative with exam, well-appearing MSK:  Right knee/calf: No specific point tenderness of the gastrocnemius medially. Normal strength resistance with plantarflexion and dorsiflexion. Can rise up on tiptoes. 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   Has some pain with speeds care exercises Neurovascularly intact  Limited ultrasound: Right gastrocnemius:  The medial head of the gastrocnemius at the fascial layer still has this defect.  There is hyperemia  in this area.  Summary: Ongoing gastrocnemius strain.  Ultrasound and interpretation by Clearance Coots, MD   ASSESSMENT & PLAN:   Strain of calf muscle Evidence of the strain is still present on ultrasound.  She is able to do dynamic movements but still has mild pain associated with it.  She would like to get back to playing volleyball  -Counseled on home exercise therapy and supportive care. -Referral to physical therapy. -Counseled on compression.

## 2019-12-01 NOTE — Patient Instructions (Signed)
Good to see you Please continue the exercises  Please try heat  You can stop by physical therapy down the hall if you'd like  Please send me a message in MyChart with any questions or updates.  Please see me back in 4 weeks or as needed if better .   --Dr. Raeford Razor

## 2019-12-01 NOTE — Assessment & Plan Note (Signed)
Evidence of the strain is still present on ultrasound.  She is able to do dynamic movements but still has mild pain associated with it.  She would like to get back to playing volleyball  -Counseled on home exercise therapy and supportive care. -Referral to physical therapy. -Counseled on compression.

## 2019-12-08 ENCOUNTER — Ambulatory Visit: Payer: Managed Care, Other (non HMO) | Attending: Family Medicine | Admitting: Physical Therapy

## 2019-12-08 ENCOUNTER — Other Ambulatory Visit: Payer: Self-pay

## 2019-12-08 ENCOUNTER — Encounter: Payer: Self-pay | Admitting: Physical Therapy

## 2019-12-08 DIAGNOSIS — M79661 Pain in right lower leg: Secondary | ICD-10-CM | POA: Insufficient documentation

## 2019-12-08 DIAGNOSIS — M62831 Muscle spasm of calf: Secondary | ICD-10-CM | POA: Diagnosis present

## 2019-12-08 DIAGNOSIS — R29898 Other symptoms and signs involving the musculoskeletal system: Secondary | ICD-10-CM

## 2019-12-08 NOTE — Therapy (Signed)
[facility] [place] [address]  [place], [place], [address] Phone: [phone]   Fax:  [phone]  Physical Therapy Evaluation  Patient Details  Name: Anita Odonnell MRN: [id number] Date of Birth: [birth year] Referring Provider (PT): Clearance Coots, MD   Encounter Date: 12/08/2019  PT End of Session - 12/08/19 1704    Visit Number  1    Number of Visits  8    Date for PT Re-Evaluation  01/05/20    Authorization Type  Cign[employer]    Authorization - Number of Visits  --   VL = 60   PT Start Time  [id number]    PT Stop Time  1801    PT Time Calculation (min)  57 min    Activity Tolerance  Patient tolerated treatment well    Behavior During Therapy  [facility] for tasks assessed/performed       Past Medical History:  Diagnosis Date  . Hypothyroidism     Past Surgical History:  Procedure Laterality Date  . DILATION AND CURETTAGE OF UTERUS    . SHOULDER SURGERY Right 1997   torn labrium    There were no vitals filed for this visit.   Subjective Assessment - 12/08/19 1706    Subjective  Pt reports initial injury while playing volleyball 7 weeks ago (10/18/19) resulting in R proximal calf strain/tear. Rested for 2 weeks and resumed activity including volleyball but felt it tear again on 11/01/19. Went to MD 11/03/19 and grade 1 tear of R proximal gastroc identified via [place]. Has been working on HEP provided by MD x 4 weeks. Now 5 weeks from second injury and still feels tightness/discomfort.    Diagnostic tests  11/03/19 limited diagnostic US R calf: Near the medial head (of gastroc) there is a change of the fascia layer and slight muscle hypoechoic changes to suggest a strain in this area.    Patient Stated Goals  "To be able to resume playing volleyball w/o reinjuring calf."    Currently in Pain?  No/denies         [facility] PT Assessment - 12/08/19 1704      Assessment   Medical Diagnosis  R calf strain    Referring Provider (PT)  Clearance Coots, MD    Onset Date/Surgical Date  11/01/19   most recent injury; initial injury 2 weeks prior on 10/28/19   Next MD Visit  ~4 weeks    Prior Therapy  PT for elbow issues & shoulder pain      Precautions   Precautions  None      Restrictions   Weight Bearing Restrictions  No      Balance Screen   Has the patient fallen in the past 6 months  No    Has the patient had a decrease in activity level because of a fear of falling?   No    Is the patient reluctant to leave their home because of a fear of falling?   No      Home Environment   Living Environment  Private residence    Type of [place] Access  Level entry    [place]  One level      Prior Function   Level of [place] Requirements  computer work - mostly standing (sit to stand desk)    Leisure  volleyball, group exercise classes and spin classes, garden, read  Cognition   Overall Cognitive Status  Within Functional Limits for tasks assessed      Observation/Other Assessments   Focus on Therapeutic Outcomes (FOTO)   Lower leg - 71% (29% limitation); Predicted 83% (17% limitation)      ROM / Strength   AROM / PROM / Strength  AROM;PROM;Strength      AROM   AROM Assessment Site  Ankle    Right/Left Ankle  Right;Left    Right Ankle Dorsiflexion  21    Right Ankle Plantar Flexion  49    Right Ankle Inversion  49    Right Ankle Eversion  27    Left Ankle Dorsiflexion  13    Left Ankle Plantar Flexion  56    Left Ankle Inversion  50    Left Ankle Eversion  29      PROM   PROM Assessment Site  Ankle    Right/Left Ankle  Right    Right Ankle Dorsiflexion  20      Strength   Strength Assessment Site  Ankle    Right/Left Ankle  Right;Left    Right Ankle Dorsiflexion  5/5    Right Ankle Plantar Flexion  4+/5    Right Ankle Inversion  4+/5    Right Ankle Eversion  4+/5    Left Ankle Dorsiflexion  5/5    Left Ankle Plantar Flexion  5/5    Left Ankle Inversion   5/5    Left Ankle Eversion  5/5      Palpation   Palpation comment  pt denies ttp over R gastroc/soleus                  Objective measurements completed on examination: See above findings.      [place] Adult PT Treatment/Exercise - 12/08/19 1704      Exercises   Exercises  Ankle      Ankle Exercises: Stretches   Gastroc Stretch  30 seconds;2 reps    Gastroc Stretch Limitations  seated with towel + hip hinge for HS stretch      Ankle Exercises: Standing   SLS  pt reports she has been working on SLS as part of MD HEP and has recently introduced SLS on BOSU    Heel Raises  Both;Right;10 reps;3 seconds    Heel Raises Limitations  B con/R ecc - pt reporting she does these with added weight at the gym      Ankle Exercises: Seated   Other Seated Ankle Exercises  R ankle green TB 4-way x 10             PT Education - 12/08/19 1800    Education Details  PT eval findings, anatomy of calf muscles, anticipated POC, review of current exercises & initial PT HEP    Person(s) Educated  Patient    Methods  Explanation;Demonstration;Handout    Comprehension  Verbalized understanding;Returned demonstration;Need further instruction          PT Long Term Goals - 12/08/19 1801      PT LONG TERM GOAL #1   Title  Patient will be independent with ongoing/advanced HEP    Status  New    Target Date  01/05/20      PT LONG TERM GOAL #2   Title  Patient to improve R DF AROM to WNL/equivalent to L without pain provocation    Status  New    Target Date  01/05/20      PT LONG TERM  GOAL #3   Title  Patient will demonstrate improved R ankle strength to >/= 5/5 for improved stability and ease of mobility    Status  New    Target Date  01/05/20      PT LONG TERM GOAL #4   Title  Patient to return to working out and playing volleyball w/o limitation due to R calf pain    Status  New    Target Date  01/05/20             Plan - 12/08/19 1801    Clinical Impression  Statement  Anita Odonnell is 64 y/o female who presents to OP PT for a R proximal medial gastrocnemius calf strain initially suffered while playing volleyball on 10/18/19 and reinjured again while playing volleyball on 11/01/19. Patient reports she has been performing MD prescribed HEP however still feels tightness and discomfort in R proximal calf but denies pain. R ankle AROM only mildly limited in DF as compared to L, with only very mild weakness/increased fatigue noted. Farha will benefit from skilled PT restore functional ROM, strength and proprioception in R foot and ankle for improved exercise and activity tolerance to allow her to return to playing volleyball with reduced risk for further injury.    Personal Factors and Comorbidities  Time since onset of injury/illness/exacerbation;Comorbidity 3+    Comorbidities  h/o medial meniscus tear, partial R RTC tear, hypothyroidism, leukopenia    Examination-Activity Limitations  Squat;Other   jumping   Examination-Participation Restrictions  Community Activity;Other   playing volleyball   Stability/Clinical Decision Making  Stable/Uncomplicated    Clinical Decision Making  Low    Rehab Potential  Good    PT Frequency  2x / week   1-2x/wk   PT Duration  4 weeks    PT Treatment/Interventions  ADLs/Self Care Home Management;Electrical Stimulation;Iontophoresis 4mg /ml Dexamethasone;Moist Heat;Ultrasound;Gait training;Functional mobility training;Therapeutic activities;Therapeutic exercise;Balance training;Neuromuscular re-education;Patient/family education;Manual techniques;Passive range of motion;Dry needling;Taping    PT Next Visit Plan  Progress MD & PT HEP as appropriate - dynamic stretching/strengthening, proprioceptive training, light plyometrics    PT Home Exercise Plan  5/25 - gastroc/HS stretch, green TB 4-way ankle    Consulted and Agree with Plan of Care  Patient       Patient will benefit from skilled therapeutic intervention in order to improve the  following deficits and impairments:  Decreased activity tolerance, Decreased balance, Decreased knowledge of precautions, Decreased range of motion, Decreased strength, Increased fascial restricitons, Increased muscle spasms, Impaired perceived functional ability, Pain  Visit Diagnosis: Pain of right lower leg  Muscle spasm of calf  Other symptoms and signs involving the musculoskeletal system     Problem List Patient Active Problem List   Diagnosis Date Noted  . Strain of calf muscle 11/03/2019  . Partial tear of right rotator cuff 02/21/2016  . Leukopenia 12/30/2013  . Hypothyroidism 12/30/2013  . Medial meniscus tear 04/10/2012    Percival Spanish, PT, MPT 12/08/2019, 7:12 PM  [facility] [address]  [address] [place], [place], [address] Phone: [phone]   Fax:  [phone]  Name: NAYIA GRONSETH MRN: [id number] Date of Birth: [birth year]

## 2019-12-08 NOTE — Patient Instructions (Signed)
    Home exercise program created by JoAnne Kreis, PT.  For questions, please contact JoAnne via phone at [phone] or email at [email or web address]  [facility] [place] [address]  [address] [place], [place], [address] Phone: [phone]   Fax:  [phone]    

## 2019-12-16 ENCOUNTER — Ambulatory Visit: Payer: Managed Care, Other (non HMO) | Attending: Family Medicine

## 2019-12-16 ENCOUNTER — Other Ambulatory Visit: Payer: Self-pay

## 2019-12-16 DIAGNOSIS — M79661 Pain in right lower leg: Secondary | ICD-10-CM | POA: Diagnosis present

## 2019-12-16 DIAGNOSIS — R29898 Other symptoms and signs involving the musculoskeletal system: Secondary | ICD-10-CM | POA: Diagnosis present

## 2019-12-16 DIAGNOSIS — M62831 Muscle spasm of calf: Secondary | ICD-10-CM

## 2019-12-16 NOTE — Therapy (Signed)
[facility] [address]  [place], [place], [address] Phone: [phone]   Fax:  [phone]  Physical Therapy Treatment  Patient Details  Name: Anita Odonnell MRN: [id number] Date of Birth: 1955-11-02 Referring Provider (PT): Clearance Coots, MD   Encounter Date: 12/16/2019  PT End of Session - 12/16/19 1721    Visit Number  2    Number of Visits  8    Date for PT Re-Evaluation  01/05/20    Authorization Type  Cigna    Authorization - Number of Visits  --   VL = 60   PT Start Time  1703    PT Stop Time  1800    PT Time Calculation (min)  57 min    Activity Tolerance  Patient tolerated treatment well    Behavior During Therapy  [facility] for tasks assessed/performed       Past Medical History:  Diagnosis Date  . Hypothyroidism     Past Surgical History:  Procedure Laterality Date  . DILATION AND CURETTAGE OF UTERUS    . SHOULDER SURGERY Right 1997   torn labrium    There were no vitals filed for this visit.  Subjective Assessment - 12/16/19 1711    Subjective  Pt. noting some issues with EV, IV band exercises.    Diagnostic tests  11/03/19 limited diagnostic US R calf: Near the medial head (of gastroc) there is a change of the fascia layer and slight muscle hypoechoic changes to suggest a strain in this area.    Patient Stated Goals  "To be able to resume playing volleyball w/o reinjuring calf."    Currently in Pain?  No/denies    Pain Score  0-No pain    Multiple Pain Sites  No         [facility] PT Assessment - 12/16/19 0001      Strength   Strength Assessment Site  Hip    Right/Left Hip  Right;Left    Right Hip Extension  4/5    Right Hip ABduction  5/5    Right Hip ADduction  4/5    Left Hip Extension  4/5    Left Hip ABduction  4+/5    Left Hip ADduction  4/5                    [facility] Adult PT Treatment/Exercise - 12/16/19 0001      Knee/Hip Exercises: Standing   Hip Flexion   Right;Left;10 reps;Knee straight;Stengthening    Hip Flexion Limitations  yellow TB at ankle     Hip ADduction  Right;Left;10 reps;Strengthening    Hip ADduction Limitations  yellow TB at ankle; 1 ski pole    Hip Abduction  Right;Left;10 reps;Knee straight;Stengthening    Abduction Limitations  yellow TB at ankle; 1 ski pole    Hip Extension  Right;Left;10 reps;Knee straight;Stengthening    Extension Limitations  yellow TB at ankle; 1 ski pole    SLS with Vectors  L cone knock over/righting with R SLS x 14 cones - good R ankle stability noted       Ankle Exercises: Stretches   Soleus Stretch  2 reps;30 seconds    Gastroc Stretch  30 seconds;2 reps    Gastroc Stretch Limitations  gentle at counter     Other Stretch  Seated R strap assisted gastroc stretch x 30 sec     Other Stretch  Supine R HS/GS stretch with strap  2 x 30 sec       Ankle Exercises: Standing   Heel Raises  Both;15 reps;3 seconds    Heel Raises Limitations  Focusing on 3 sec eccentric B lowering     Other Standing Ankle Exercises  B LE BOSU ball balance (down)  forwards/back, R/L x 10 rpes    good control     Ankle Exercises: Aerobic   Nustep  lvl 5, 6 min (UE/LE)      Ankle Exercises: Seated   Other Seated Ankle Exercises  R ankle green TB 4-way x 10      Ankle Exercises: Supine   Other Supine Ankle Exercises  Bridge + adduction ball squeeze 3" x 10 reps                   PT Long Term Goals - 12/16/19 1721      PT LONG TERM GOAL #1   Title  Patient will be independent with ongoing/advanced HEP    Status  On-going      PT LONG TERM GOAL #2   Title  Patient to improve R DF AROM to WNL/equivalent to L without pain provocation    Status  On-going      PT LONG TERM GOAL #3   Title  Patient will demonstrate improved R ankle strength to >/= 5/5 for improved stability and ease of mobility    Status  On-going      PT LONG TERM GOAL #4   Title  Patient to return to working out and playing volleyball  w/o limitation due to R calf pain    Status  On-going            Plan - 12/16/19 1732    Clinical Impression Statement  Anita Odonnell with good overall demonstrating of HEP with review today.  Able to progress to dynamic SLS activities (4-way hip kicker with yellow TB, cone knock over/righting) verbalizing some R LE fatigue however without complaint of pain.  Occasionally cued pt. for proper pacing however pt. generally thoughtful with therex activities with good awareness of proper technique.  Will consider HEP updated in coming session to progress to dynamic SLS activities.    Comorbidities  h/o medial meniscus tear, partial R RTC tear, hypothyroidism, leukopenia    Rehab Potential  Good    PT Treatment/Interventions  ADLs/Self Care Home Management;Electrical Stimulation;Iontophoresis 4mg /ml Dexamethasone;Moist Heat;Ultrasound;Gait training;Functional mobility training;Therapeutic activities;Therapeutic exercise;Balance training;Neuromuscular re-education;Patient/family education;Manual techniques;Passive range of motion;Dry needling;Taping    PT Next Visit Plan  Monitor tolerance to 4-way ankle kicker (pt. requesting for HEP eventually); dynamic stretching/strengthening, proprioceptive training, light plyometrics    PT Home Exercise Plan  5/25 - gastroc/HS stretch, green TB 4-way ankle    Consulted and Agree with Plan of Care  Patient       Patient will benefit from skilled therapeutic intervention in order to improve the following deficits and impairments:  Decreased activity tolerance, Decreased balance, Decreased knowledge of precautions, Decreased range of motion, Decreased strength, Increased fascial restricitons, Increased muscle spasms, Impaired perceived functional ability, Pain  Visit Diagnosis: Pain of right lower leg  Muscle spasm of calf  Other symptoms and signs involving the musculoskeletal system     Problem List Patient Active Problem List   Diagnosis Date Noted  .  Strain of calf muscle 11/03/2019  . Partial tear of right rotator cuff 02/21/2016  . Leukopenia 12/30/2013  . Hypothyroidism 12/30/2013  . Medial meniscus tear 04/10/2012    Bess Harvest, PTA  12/16/19 9:47 PM   [facility] [address]  [place], [place], [address] Phone: [phone]   Fax:  [phone]  Name: Anita Odonnell MRN: [id number] Date of Birth: Aug 17, 1955

## 2019-12-22 ENCOUNTER — Ambulatory Visit: Payer: Managed Care, Other (non HMO) | Admitting: Physical Therapy

## 2019-12-22 ENCOUNTER — Ambulatory Visit (INDEPENDENT_AMBULATORY_CARE_PROVIDER_SITE_OTHER): Payer: Managed Care, Other (non HMO) | Admitting: Dermatology

## 2019-12-22 ENCOUNTER — Encounter: Payer: Self-pay | Admitting: Dermatology

## 2019-12-22 ENCOUNTER — Other Ambulatory Visit: Payer: Self-pay

## 2019-12-22 DIAGNOSIS — R29898 Other symptoms and signs involving the musculoskeletal system: Secondary | ICD-10-CM

## 2019-12-22 DIAGNOSIS — L720 Epidermal cyst: Secondary | ICD-10-CM

## 2019-12-22 DIAGNOSIS — M62831 Muscle spasm of calf: Secondary | ICD-10-CM

## 2019-12-22 DIAGNOSIS — D225 Melanocytic nevi of trunk: Secondary | ICD-10-CM | POA: Diagnosis not present

## 2019-12-22 DIAGNOSIS — L570 Actinic keratosis: Secondary | ICD-10-CM

## 2019-12-22 DIAGNOSIS — M79661 Pain in right lower leg: Secondary | ICD-10-CM

## 2019-12-22 DIAGNOSIS — I781 Nevus, non-neoplastic: Secondary | ICD-10-CM | POA: Diagnosis not present

## 2019-12-22 DIAGNOSIS — D229 Melanocytic nevi, unspecified: Secondary | ICD-10-CM

## 2019-12-22 NOTE — Therapy (Signed)
[facility] [address]  [place], [place], [address] Phone: [phone]   Fax:  [phone]  Physical Therapy Treatment  Patient Details  Name: Anita Odonnell MRN: [id number] Date of Birth: 28-Nov-1955 Referring Provider (PT): Clearance Coots, MD   Encounter Date: 12/22/2019  PT End of Session - 12/22/19 1018    Visit Number  3    Number of Visits  8    Date for PT Re-Evaluation  01/05/20    Authorization Type  [employer]    PT Start Time  1019    PT Stop Time  1108    PT Time Calculation (min)  49 min    Activity Tolerance  Patient tolerated treatment well    Behavior During Therapy  [facility] for tasks assessed/performed       Past Medical History:  Diagnosis Date  . Hypothyroidism     Past Surgical History:  Procedure Laterality Date  . DILATION AND CURETTAGE OF UTERUS    . SHOULDER SURGERY Right 1997   torn labrium    There were no vitals filed for this visit.  Subjective Assessment - 12/22/19 1021    Subjective  Pt reports that after friday she did a lot of walking and now feels the calf now    Patient Stated Goals  "To be able to resume playing volleyball w/o reinjuring calf."    Currently in Pain?  Yes    Pain Score  2     Pain Location  Leg    Pain Descriptors / Indicators  Aching;Dull         [facility] PT Assessment - 12/22/19 0001      Assessment   Medical Diagnosis  R calf strain    Referring Provider (PT)  Clearance Coots, MD                    [facility] Adult PT Treatment/Exercise - 12/22/19 0001      Modalities   Modalities  Moist Heat      Moist Heat Therapy   Number Minutes Moist Heat  10 Minutes    Moist Heat Location  --   Rt calf     Manual Therapy   Manual Therapy  Soft tissue mobilization;Taping    Manual therapy comments  skilled palpation and monitoring during DN    Soft tissue mobilization  STM to Rt calf with TPR with IASTM    Kinesiotex  Facilitate Muscle;Create  Space      Kinesiotix   Create Space  I strip across top of calf to off load tighness     Facilitate Muscle   Y strip anchored at achilles spit around gastroc      Ankle Exercises: Aerobic   Recumbent Bike  L5x5'   VC to keep heels dropped     Ankle Exercises: Standing   SLS  SLS each side with clock work the other leg,  next to a wall for safety       Ankle Exercises: Stretches   Gastroc Stretch  20 seconds   rt      Trigger Point Dry Needling - 12/22/19 0001    Consent Given?  Yes    Education Handout Provided  Yes    Muscles Treated Lower Quadrant  Gastrocnemius;Soleus    Electrical Stimulation Performed with Dry Needling  --   attempted - to much for pt   Gastrocnemius Response  Palpable increased muscle length;Twitch response elicited  Soleus Response  Palpable increased muscle length;Twitch response elicited           PT Education - 12/22/19 1032    Education Details  HEP and DN    Person(s) Educated  Patient    Methods  Handout;Demonstration;Explanation    Comprehension  Verbalized understanding;Returned demonstration          PT Long Term Goals - 12/16/19 1721      PT LONG TERM GOAL #1   Title  Patient will be independent with ongoing/advanced HEP    Status  On-going      PT LONG TERM GOAL #2   Title  Patient to improve R DF AROM to WNL/equivalent to L without pain provocation    Status  On-going      PT LONG TERM GOAL #3   Title  Patient will demonstrate improved R ankle strength to >/= 5/5 for improved stability and ease of mobility    Status  On-going      PT LONG TERM GOAL #4   Title  Patient to return to working out and playing volleyball w/o limitation due to R calf pain    Status  On-going            Plan - 12/22/19 1215    Clinical Impression Statement  Pt frustrated with what she perceives as slow progress, she had palpable banding in the Rt gastroc, performed a little dry needling and some manaul IASTM to the same area.  Rock  tape applied to assist the muscle pt instructed in how to apply.  She reported improved feeling of mobility in the calf and some decrease in pain at end of session.    Rehab Potential  Good    PT Frequency  2x / week    PT Duration  4 weeks    PT Treatment/Interventions  ADLs/Self Care Home Management;Electrical Stimulation;Iontophoresis 4mg /ml Dexamethasone;Moist Heat;Ultrasound;Gait training;Functional mobility training;Therapeutic activities;Therapeutic exercise;Balance training;Neuromuscular re-education;Patient/family education;Manual techniques;Passive range of motion;Dry needling;Taping    PT Next Visit Plan  assess response to DN and tape continue with proprioception work    Consulted and Agree with Plan of Care  Patient       Patient will benefit from skilled therapeutic intervention in order to improve the following deficits and impairments:  Decreased activity tolerance, Decreased balance, Decreased knowledge of precautions, Decreased range of motion, Decreased strength, Increased fascial restricitons, Increased muscle spasms, Impaired perceived functional ability, Pain  Visit Diagnosis: Pain of right lower leg  Muscle spasm of calf  Other symptoms and signs involving the musculoskeletal system     Problem List Patient Active Problem List   Diagnosis Date Noted  . Strain of calf muscle 11/03/2019  . Partial tear of right rotator cuff 02/21/2016  . Leukopenia 12/30/2013  . Hypothyroidism 12/30/2013  . Medial meniscus tear 04/10/2012    Jeral Pinch PT  12/22/2019, 12:18 PM  [facility] [address]  [place], [place], [address] Phone: [phone]   Fax:  [phone]  Name: Anita Odonnell MRN: [id number] Date of Birth: 15-Jan-1956

## 2019-12-22 NOTE — Patient Instructions (Signed)
Access Code: [email or web address] URL: [email or web address] Date: 12/22/2019 Prepared by: Jeral Pinch  Exercises Single Leg Balance with Clock Reach - 1 x daily - 3 sets - 10 reps  Patient Education Trigger Point Dry Needling

## 2019-12-22 NOTE — Patient Instructions (Addendum)
Routine follow-up for Anita Odonnell date of birth [birth year].  She has an itchy spot on her upper back; examination showed an inflamed 8 mm crust which fits a lichenoid actinic keratosis.  This was treated with 5 seconds of liquid nitrogen spray.  If this fails, she may return for a simple shave biopsy.  She dislikes the little solar ectasia on her upper nose.  Treatment options discussed included vascular laser (best done by Dr. Napoleon Form at [facility]) or microcautery.  We chose microcautery for low (0) procedural cost.  Laurisa understands that this only works 50+ percent of the time if the red dot recurs then she could call up and I will repeat this a maximum of 1 time.  The rest of her skin exam was remarkably clear with no other suspicious spots on the back a small stucco keratosis on the left mid arm and no atypical moles. Tiny milium below lip.

## 2019-12-28 ENCOUNTER — Ambulatory Visit: Payer: Managed Care, Other (non HMO)

## 2019-12-28 ENCOUNTER — Ambulatory Visit: Payer: Managed Care, Other (non HMO) | Admitting: Family Medicine

## 2019-12-31 ENCOUNTER — Ambulatory Visit: Payer: Managed Care, Other (non HMO)

## 2019-12-31 ENCOUNTER — Other Ambulatory Visit: Payer: Self-pay

## 2019-12-31 DIAGNOSIS — M79661 Pain in right lower leg: Secondary | ICD-10-CM

## 2019-12-31 DIAGNOSIS — M62831 Muscle spasm of calf: Secondary | ICD-10-CM

## 2019-12-31 DIAGNOSIS — R29898 Other symptoms and signs involving the musculoskeletal system: Secondary | ICD-10-CM

## 2019-12-31 NOTE — Therapy (Addendum)
[facility] [address]  [place], [place], [address] Phone: [phone]   Fax:  [phone]  Physical Therapy Treatment / Discharge Summary  Patient Details  Name: Anita Odonnell MRN: [id number] Date of Birth: [birth year] Referring Provider (PT): Clearance Coots, MD   Encounter Date: 12/31/2019   PT End of Session - 12/31/19 1811    Visit Number 4    Number of Visits 8    Date for PT Re-Evaluation 01/05/20    Authorization Type [employer]    PT Start Time 1710    PT Stop Time 1755    PT Time Calculation (min) 45 min    Activity Tolerance Patient tolerated treatment well    Behavior During Therapy [facility] for tasks assessed/performed           Past Medical History:  Diagnosis Date  . Hypothyroidism     Past Surgical History:  Procedure Laterality Date  . DILATION AND CURETTAGE OF UTERUS    . SHOULDER SURGERY Right 1997   torn labrium    There were no vitals filed for this visit.   Subjective Assessment - 12/31/19 1803    Subjective Pt is very frustrated by her injury. Her calf still feels tight, but she is unsure if it's healed or not and she really wants to return to volleyball. She was considering canceling the rest of her appointments, but will wait on it.    Patient Stated Goals "To be able to resume playing volleyball w/o reinjuring calf."    Currently in Pain? No/denies    Pain Score 0-No pain                             [facility] Adult PT Treatment/Exercise - 12/31/19 0001      Knee/Hip Exercises: Standing   Heel Raises Both;1 set;10 reps    Heel Raises Limitations off step with 1 riser, 2 up 1 down eccentric x 10    Side Lunges Both;1 set;5 reps    Side Lunges Limitations multiplanar 4 way lunge x 5 reps each   fwd, side, bkwd, curtsy   Functional Squat 1 set    Functional Squat Limitations hold + heel/toe raises x20 with TRX    SLS on blue thera pad 4 way taps in small SL squat    difficulty noted with wobbling B   Other Standing Knee Exercises TRX pull-ups: holding handles with elbows extended by hips, control fall into squat, row forward and quickly extend elbows as you lift onto toes x12    Other Standing Knee Exercises TRX quick squat to heel raise x 12, slow lower      Manual Therapy   Manual Therapy Soft tissue mobilization;Myofascial release    Soft tissue mobilization STM/XFM to medial gastroc and tendon    Myofascial Release MFR to medial gastroc      Ankle Exercises: Stretches   Soleus Stretch 1 rep;30 seconds   off edge of step   Gastroc Stretch 2 reps;30 seconds   edge of step, against counter end of session                      PT Long Term Goals - 12/16/19 1721      PT LONG TERM GOAL #1   Title Patient will be independent with ongoing/advanced HEP    Status On-going      PT LONG TERM  GOAL #2   Title Patient to improve R DF AROM to WNL/equivalent to L without pain provocation    Status On-going      PT LONG TERM GOAL #3   Title Patient will demonstrate improved R ankle strength to >/= 5/5 for improved stability and ease of mobility    Status On-going      PT LONG TERM GOAL #4   Title Patient to return to working out and playing volleyball w/o limitation due to R calf pain    Status On-going                 Plan - 12/31/19 1811    Clinical Impression Statement Pt presents with continued mild tightness in calf, but no report of pain. She is itching to return to volleyball and considering canceling PT sessions. Therefore, trialed more intense exercise to work toward jumping, including eccentric calf raises SL, quick heel raises compounded with squat + TRX and TRX pull-ups, and progressing SL balance challenge with foam pad under single leg, as well as moving lunging in clock pattern. Pt was advised she can incorporate lunging and progressed balance work into her plan and to try some gentle volleyball drills and assess how her  calf feels the next day.    Rehab Potential Good    PT Frequency 2x / week    PT Duration 4 weeks    PT Treatment/Interventions ADLs/Self Care Home Management;Electrical Stimulation;Iontophoresis '4mg'$ /ml Dexamethasone;Moist Heat;Ultrasound;Gait training;Functional mobility training;Therapeutic activities;Therapeutic exercise;Balance training;Neuromuscular re-education;Patient/family education;Manual techniques;Passive range of motion;Dry needling;Taping    PT Next Visit Plan Assess response to increasing intensity of exercises    Consulted and Agree with Plan of Care Patient           Patient will benefit from skilled therapeutic intervention in order to improve the following deficits and impairments:  Decreased activity tolerance, Decreased balance, Decreased knowledge of precautions, Decreased range of motion, Decreased strength, Increased fascial restricitons, Increased muscle spasms, Impaired perceived functional ability, Pain  Visit Diagnosis: Pain of right lower leg  Muscle spasm of calf  Other symptoms and signs involving the musculoskeletal system     Problem List Patient Active Problem List   Diagnosis Date Noted  . Strain of calf muscle 11/03/2019  . Partial tear of right rotator cuff 02/21/2016  . Leukopenia 12/30/2013  . Hypothyroidism 12/30/2013  . Medial meniscus tear 04/10/2012    Izell [place], PT, DPT 12/31/2019, 6:16 PM  [facility] [address]  [place], [place], [address] Phone: [phone]   Fax:  [phone]  Name: Anita Odonnell MRN: [id number] Date of Birth: 09/25/1955   PHYSICAL THERAPY DISCHARGE SUMMARY  Visits from Start of Care: 4  Current functional level related to goals / functional outcomes:   Refer to above clinical impression for status as of last visit on 12/31/2019. Patient cancelled all remaining appointment and requested 30-day hold stating she felt like she was  rehabilitated. She has not needed to return to PT in >30 days, therefore will proceed with discharge from PT for this episode.   Remaining deficits:   As above.    Education / Equipment:   HEP  Plan: Patient agrees to discharge.  Patient goals were not met. Patient is being discharged due to being pleased with the current functional level.  ?????     Percival Spanish, PT, MPT 02/23/20, 9:47 AM  [facility] [address]  [place], [place], [address] Phone: [phone]   Fax:  [phone]

## 2020-01-01 ENCOUNTER — Ambulatory Visit: Payer: Managed Care, Other (non HMO) | Admitting: Family Medicine

## 2020-01-05 ENCOUNTER — Ambulatory Visit: Payer: Managed Care, Other (non HMO) | Admitting: Physical Therapy

## 2020-01-07 ENCOUNTER — Ambulatory Visit: Payer: Managed Care, Other (non HMO) | Admitting: Physical Therapy

## 2020-01-15 ENCOUNTER — Encounter: Payer: Self-pay | Admitting: Dermatology

## 2020-01-15 NOTE — Progress Notes (Signed)
   Follow-Up Visit   Subjective  Anita Odonnell is a 64 y.o. female who presents for the following: Skin Problem (Patient here for spot on nose x year patient states it's broken blood vessels no bleeding.  Patient would also like you to look at a spot on her lip x 1 week no bleeding just a white head to it.).  Crust Location: Upper back Duration: Few months Quality:  Associated Signs/Symptoms: Modifying Factors:  Severity:  Timing: Context:   The following portions of the chart were reviewed this encounter and updated as appropriate: Tobacco  Allergies  Meds  Problems  Med Hx  Surg Hx  Fam Hx      Objective  Well appearing patient in no apparent distress; mood and affect are within normal limits.  Focused skin examination including head, neck, upper chest, back, arms, legs.    Assessment & Plan  AK (actinic keratosis) Mid Back  Destruction of lesion - Mid Back Complexity: simple   Destruction method: cryotherapy   Informed consent: discussed and consent obtained   Timeout:  patient name, date of birth, surgical site, and procedure verified Lesion destroyed using liquid nitrogen: Yes   Region frozen until ice ball extended beyond lesion: Yes   Cryotherapy cycles:  5 Outcome: patient tolerated procedure well with no complications    Nevus Mid Back  Annual skin examination  Telangiectasia of face Dorsum of Nose  Microcautery, Hyfrecator on low setting.  Immediate blanching achieved.  Milium Left Lower [place] Lip   Observe  Routine follow-up for Anita Odonnell date of birth [birth year].  She has an itchy spot on her upper back; examination showed an inflamed 8 mm crust which fits a lichenoid actinic keratosis.  This was treated with 5 seconds of liquid nitrogen spray.  If this fails, she may return for a simple shave biopsy.  She dislikes the little solar ectasia on her upper nose.  Treatment options discussed included vascular laser (best done by Dr.  Napoleon Form at [facility]) or microcautery.  We chose microcautery for low (0) procedural cost.  Anita Odonnell understands that this only works 50+ percent of the time if the red dot recurs then she could call up and I will repeat this a maximum of 1 time.  The rest of her skin exam was remarkably clear with no other suspicious spots on the back a small stucco keratosis on the left mid arm and no atypical moles. Tiny milium below lip.

## 2020-08-30 ENCOUNTER — Ambulatory Visit (INDEPENDENT_AMBULATORY_CARE_PROVIDER_SITE_OTHER): Payer: Managed Care, Other (non HMO) | Admitting: Sports Medicine

## 2020-08-30 ENCOUNTER — Other Ambulatory Visit: Payer: Self-pay

## 2020-08-30 ENCOUNTER — Ambulatory Visit: Payer: Self-pay

## 2020-08-30 VITALS — BP 108/78 | Ht 66.0 in | Wt 118.0 lb

## 2020-08-30 DIAGNOSIS — M25511 Pain in right shoulder: Secondary | ICD-10-CM

## 2020-08-30 DIAGNOSIS — S46311A Strain of muscle, fascia and tendon of triceps, right arm, initial encounter: Secondary | ICD-10-CM | POA: Diagnosis not present

## 2020-08-30 NOTE — Patient Instructions (Signed)
It was great to meet you today! Thank you for letting me participate in your care!  Today, we discussed your right shoulder/arm pain and it is from a partial tear of the origin of your triceps. It is partially torn so this should respond well to conservative treatment.  We suggest using Arnica Gel 3-4 times per day on the tender area. Avoid overhead exercises and anything that requires you to exercise on an outstretched arm. Please do the exercises we gave you 5 times per week. 15 reps and 2-3 sets of each exercise should be sufficient. If you can do this without pain after 2 weeks you can try an overhead serve motion with a light dumbell (2-3lbs). It should not be painful to do that exercise so if it is please stop.  We will see you back in 5 weeks to see how you are doing!  Be well, Harolyn Rutherford, DO PGY-4, Sports Medicine Fellow [place]

## 2020-08-30 NOTE — Progress Notes (Signed)
SUBJECTIVE:   CHIEF COMPLAINT / HPI:   Right shoulder pain/upper arm pain Anita Odonnell is a very pleasant 65 year old female who presents today with 2 weeks of right shoulder and right upper arm pain. This first began on February 1 while she was playing volleyball attempting an overhead serve and she hit the ball and felt immediate shoulder pain at the back of her shoulder that was sharp. This lasted for several days and she attempted to play volleyball again on February 4 but continued to have pain in the back of her shoulder so she attempted once again to back away from activity. She was continuing to play volleyball just not overhead serving. On February 10 she was doing a lot of digging during play and then she noticed after the pain started radiating down her arm. She also noticed that she had bruising at the back upper portion of her right arm. She then began having pain at night and it hurts to lay on it. Any type of movement where she extends her arm is painful in that area. She has used Aleve in the morning and evening as well as icing which helps but the pain has been returning. She does have prior history of injury to this area with partial supraspinatus rotator cuff tear some years ago and a SLAP repair that was done over 20 years ago by Dr. French Ana. Before this recent injury she was doing quite well and had no shoulder issues.  PERTINENT  PMH / PSH: History of right SLAP repair 2002, history of partial tear of right rotator cuff  OBJECTIVE:   BP 108/78   Ht 5\' 6"  (1.676 m)   Wt 118 lb (53.5 kg)   BMI 19.05 kg/m   [facility] 08/30/2020  Frequency of aerobic exercise (# of days/week) 3  Average time in minutes 60  Frequency of strengthening activities (# of days/week) 2    Shoulder, Right: No evidence of bony deformity, asymmetry, or muscle atrophy. Bruising and TTP at the origin and  insertion of the triceps. Mild swelling over origin of tricpes.  No tenderness over long head of biceps (bicipital groove). No TTP at [facility] joint. Full active and passive range of motion (180 flex Huel Cote /150Abd /90ER /70IR), Thumb to T12 without significant tenderness. Strength 5/5 throughout. No abnormal scapular function observed. Sensation intact. Peripheral pulses intact.   Special Tests:   - Crossarm test: NEG - Jobe test: Equivical   - Hawkins: NEG   - Neer test: NEG   - Belly press test: NEG   - Drop arm test: NEG - Apprehension test: NEG   - Obrien's test: NEG   - Speeds test: NEG   Limited MSK U/S: Right Shoulder and upper humerus [place] shoulder: -Biceps tendon: Well visualized within the bicipital groove.  There is no calcification in the proximal biceps. -Pectoralis: Insertion visualized and without abnormalities. -Subscapularis: Well visualized to insertion point on humerus.  No abnormalities. -AC joint: No osteophytes and no fluid collection, no significant separation -Supraspinatus: No calcifications, no fiber/tendonirregularity at the insertion of the supraspinatus. -Subacromial bursa: No bursal swelling -Infraspinatus/teres minor: Insertion point on posterior humerus visualized and without abnormalities. - Triceps: Origin of the triiceps did show irregularity and area of hypoechoic accumulation. No evidence of tearing at insertion. Conclusion: partial tear of the origin of the triceps at the musculotendinous junction  Ultrasound and interpretation by Dr. Garlan Fillers andKarl B. Fields, MD     ASSESSMENT/PLAN:  Triceps strain, right, initial encounter Patient has partial tricep strain and tearing given her history, presentation, clinical and exam findings as well as ultrasound findings today. Recommend Arnica gel 3-4 times daily, given home exercises and rehab to do, avoid strenuous overhead activities and anything that involves strenuous arm extension. She can continue Aleve as needed for pain. -Follow-up in 5 weeks for repeat  scan     Anita Alpha, DO PGY-4, Sports Medicine Fellow [place]  I observed and examined the patient with the [facility] resident and agree with assessment and plan.  Note reviewed and modified by me. Ila Mcgill, MD

## 2020-08-30 NOTE — Assessment & Plan Note (Signed)
Patient has partial tricep strain and tearing given her history, presentation, clinical and exam findings as well as ultrasound findings today. Recommend Arnica gel 3-4 times daily, given home exercises and rehab to do, avoid strenuous overhead activities and anything that involves strenuous arm extension. She can continue Aleve as needed for pain. -Follow-up in 5 weeks for repeat scan

## 2020-09-05 ENCOUNTER — Ambulatory Visit (INDEPENDENT_AMBULATORY_CARE_PROVIDER_SITE_OTHER): Payer: Managed Care, Other (non HMO) | Admitting: Dermatology

## 2020-09-05 ENCOUNTER — Other Ambulatory Visit: Payer: Self-pay

## 2020-09-05 ENCOUNTER — Encounter: Payer: Self-pay | Admitting: Dermatology

## 2020-09-05 DIAGNOSIS — L720 Epidermal cyst: Secondary | ICD-10-CM | POA: Diagnosis not present

## 2020-09-05 DIAGNOSIS — I781 Nevus, non-neoplastic: Secondary | ICD-10-CM

## 2020-09-05 DIAGNOSIS — Z1283 Encounter for screening for malignant neoplasm of skin: Secondary | ICD-10-CM | POA: Diagnosis not present

## 2020-09-06 ENCOUNTER — Ambulatory Visit: Payer: Managed Care, Other (non HMO) | Admitting: Sports Medicine

## 2020-09-11 ENCOUNTER — Encounter: Payer: Self-pay | Admitting: Dermatology

## 2020-09-11 NOTE — Progress Notes (Signed)
   Follow-Up Visit   Subjective  Anita Odonnell is a 65 y.o. female who presents for the following: Skin Problem (Bridge of nose - red spot & spots between breast- raised bump).  General skin check Location: Check spots nose and chest Duration:  Quality:  Associated Signs/Symptoms: Modifying Factors:  Severity:  Timing: Context:   Objective  Well appearing patient in no apparent distress; mood and affect are within normal limits. Objective  Right Breast: 1.2 cm deep dermal to subcutaneous noninflamed epidermoid cyst  Objective  Dorsum of Nose: Macular 3 mm red spot that easily blanches with pressure; dermoscopy shows telangiectasia without evidence of basal cell carcinoma.  Objective  Left Upper Back: Waist up skin examination- no atypical moles or non mole skin cacner   All sun exposed areas plus back examined.  Plus chest, arms, legs.   Assessment & Plan    Epidermal cyst Right Breast  Okay to leave if stable; told a small risk of inflammation.  Spider telangiectasia Dorsum of Nose  Micro record are reviewed with low setting 0.0.  Immediate complete blanching occurred but patient told that recurrence is common.  Encounter for screening for malignant neoplasm of skin Left Upper Back  Yearly skin check     I, Lavonna Monarch, MD, have reviewed all documentation for this visit.  The documentation on 09/11/20 for the exam, diagnosis, procedures, and orders are all accurate and complete.

## 2020-10-11 ENCOUNTER — Other Ambulatory Visit: Payer: Self-pay

## 2020-10-11 ENCOUNTER — Ambulatory Visit (INDEPENDENT_AMBULATORY_CARE_PROVIDER_SITE_OTHER): Payer: Managed Care, Other (non HMO) | Admitting: Sports Medicine

## 2020-10-11 DIAGNOSIS — S46311A Strain of muscle, fascia and tendon of triceps, right arm, initial encounter: Secondary | ICD-10-CM

## 2020-10-11 NOTE — Assessment & Plan Note (Signed)
This has significantly improved Ultrasound shows only a minor area of swelling remaining with some calcifications from healing  She should progress this over the next 4 to 6 weeks and be able to return to volleyball competition  Recheck if not progressing normally

## 2020-10-11 NOTE — Patient Instructions (Signed)
Here are the exercises we discussed:  1. Internal and external rotation with 2-3 lb weights  2. Triceps exercises in 2 variations again with light weights  3. Stretching with light weights or an exercise band across your chest 4. Overhead follow through with a 2-3 lb weight   Plan to follow up in 6 weeks if you have any concerns/questions.

## 2020-10-11 NOTE — Progress Notes (Signed)
    SUBJECTIVE:   CHIEF COMPLAINT / HPI:   Triceps partial tear [name] is a very pleasant 65 year old female who presents today for follow-up of her right tricep partial tear.  She states that overall the pain has improved.  She still notices some discomfort with certain positioning.  She also endorses some point tenderness more medially along the trapezius muscle.  She states that she has continued doing the strengthening exercises provided during her last visit as well as triceps exercises.  She is not played volleyball since the injury.  She has been icing her shoulder which has improved her pain.  PERTINENT  PMH / PSH: History of right SLAP repair 2002, history of partial tear of right rotator cuff  OBJECTIVE:   BP 90/60   Ht 5\' 6"  (1.676 m)   Wt 115 lb (52.2 kg)   BMI 18.56 kg/m   [facility] 08/30/2020  Frequency of aerobic exercise (# of days/week) 3  Average time in minutes 60  Frequency of strengthening activities (# of days/week) 2    Shoulder, Right: No evidence of bony deformity, bruising or asymmetry.  Point tenderness over the trapezius muscle.  No tenderness over the biceps tendon or AC joint.  Full active and passive range of motion.  Strength 5 out of 5 and sensation intact. Triceps strength seems quite good in several positions The bruising over the triceps has resolved and there is no point tenderness   Special Tests:   - Crossarm test: NEG   - Hawkins: NEG   - Neer test: NEG   - Drop arm test: NEG   - Empty can test: NEG    Limited MSK U/S: Right Shoulder and upper humerus [place] shoulder: -Biceps tendon: Well visualized within the bicipital groove.  There is no calcification in the proximal biceps. -Pectoralis: Insertion visualized and without abnormalities. -Subscapularis: Well visualized to insertion point on humerus.  No abnormalities. -AC joint: No osteophytes and no fluid collection, no significant  separation -Supraspinatus: No calcifications, no fiber/tendonirregularity at the insertion of the supraspinatus. -Subacromial bursa: No bursal swelling -Infraspinatus/teres minor: Insertion point on posterior humerus visualized and without abnormalities. - Triceps: Previously seen irregularity along the origin of the triceps looks improved with evidence of some calcifications within the tendon.  Previously seen inflammation has also significantly improved Conclusion: partial tear of the origin of the triceps at the musculotendinous junction  Ultrasound and interpretation by Dr. Laural Golden andKarl B. Fields, MD     ASSESSMENT/PLAN:   No problem-specific Assessment & Plan notes found for this encounter.  Partial right triceps strain Significant improvement of partial right tricep strain and tearing.  Recommend continuing home exercises and can resume volleyball with pain as a guide.  Continue Aleve as needed for pain.  Recommended following up in 6 weeks if there are any concerns or questions. -Follow-up in 6 weeks if pain persists or worsens   Anita Odonnell Anita Quint, DO PGY-2 IM  [place]  I observed and examined the patient with the [facility] resident and agree with assessment and plan.  Note reviewed and modified by me. Anita Mcgill, MD

## 2020-11-22 ENCOUNTER — Ambulatory Visit: Payer: Managed Care, Other (non HMO) | Admitting: Sports Medicine

## 2021-01-18 ENCOUNTER — Ambulatory Visit: Payer: Managed Care, Other (non HMO) | Admitting: Dermatology

## 2021-03-23 ENCOUNTER — Encounter: Payer: Managed Care, Other (non HMO) | Admitting: Dermatology

## 2021-10-10 ENCOUNTER — Ambulatory Visit (INDEPENDENT_AMBULATORY_CARE_PROVIDER_SITE_OTHER): Payer: Managed Care, Other (non HMO) | Admitting: Sports Medicine

## 2021-10-10 ENCOUNTER — Ambulatory Visit: Payer: Self-pay

## 2021-10-10 VITALS — BP 118/86 | Ht 66.0 in | Wt 115.0 lb

## 2021-10-10 DIAGNOSIS — M25511 Pain in right shoulder: Secondary | ICD-10-CM | POA: Diagnosis not present

## 2021-10-10 DIAGNOSIS — S4351XA Sprain of right acromioclavicular joint, initial encounter: Secondary | ICD-10-CM | POA: Diagnosis not present

## 2021-10-10 MED ORDER — DICLOFENAC SODIUM 1 % EX GEL: 4.0000 g | Freq: Four times a day (QID) | CUTANEOUS | 0 refills | Status: AC

## 2021-10-10 NOTE — Progress Notes (Signed)
PCP: Harlan Stains, MD ? ?Subjective:  ? ?HPI: ?Dannika is a pleasant 66 y.o. female here for evaluation of right shoulder pain. ? ?She states on 3/16 she was playing volleyball with friends when she went to extend both of her arms to reach out to get a ball in the top of her right shoulder collided with the thigh of one of her teammates.  She had initial pain and over the last 2 weeks has been abstaining from physical activity.  Initially she had quite a bit of pain and some bruising on the top part of her shoulder, here over the last few days her pain has improved to some extent in her range of motion is better as well, although she did previously have pain with reaching overhead or anything above 90 degrees.  She is getting pain with sleeping on that shoulder at nighttime.  She denies any radicular symptoms or numbness tingling extending down the arm.  She does feel like the top part of the shoulder may be slightly swollen as well.  She is taking Aleve and Tylenol for pain control.  She does note that she knows this is getting better, she just wants to rule out any gross tearing so that she can get her return back to volleyball and other activity. ? ?BP 118/86   Ht '5\' 6"'$  (1.676 m)   Wt 115 lb (52.2 kg)   BMI 18.56 kg/m?  ? ? ?  08/30/2020  ?  8:26 AM 10/10/2021  ? 11:32 AM  ?[place] Adult Exercise  ?Frequency of aerobic exercise (# of days/week) 3 2  ?Average time in minutes 60 45  ?Frequency of strengthening activities (# of days/week) 2 3  ? ? ?   ? View : No data to display.  ?  ?  ?  ? ? ?    ?Objective:  ?Physical Exam: ? ?Gen: Well-appearing, in no acute distress; non-toxic ?CV: Regular Rate. Well-perfused. Warm.  ?Resp: Breathing unlabored on room air; no wheezing. ?Psych: Fluid speech in conversation; appropriate affect; normal thought process ?Neuro: Sensation intact throughout. No gross coordination deficits.  ?MSK:  ?- Right shoulder: + TTP noted over the right AC joint.  Inspection does  yield a mild degree of puffiness over the [facility] joint and some healing bruising over the superior anterior aspect of the right shoulder.  There is no tenderness to palpation within the bicipital groove.  She does have full active and passive range of motion (180? flex /60?Ext /150?Abd /90?ER /70?IR).  Strength 5/5 throughout, although there is some pain with resisted abduction.  Pulses intact distally.  Equivocal empty can testing.  Positive crossarm abduction test.  Negative Hawkins impingement, negative speeds testing. ? ?MSK Complete US of Shoulder, right  ? ?Patient was seated on exam table and shoulder US examination was performed using high frequency linear probe.  ?-The biceps tendon was visualized within the bicipital groove in both longitudinal and transverse axis with no signs of fluid or hypoechoic changes.  ?-Subscapularis tendon visualized in both long and transverse axis without abnormality.   ?-AC joint was visualized with notable bursal distention and positive mushroom sign with hypoechoic fluid surrounding the joint.  There is no cortical irregularity noted within the [facility] joint.   ?-Evaluation of the supraspinatus tendon demonstrates calcific change and some mild chronic degeneration of the tendon without evidence of gross or full-thickness tearing.   ?-Infraspinatus visualized in transverse axis with out tendon irregularity or evidence of tearing.   ? ?  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 ?Ultrasound and interpretation by Dr. Rolena Infante and Wolfgang Phoenix. Fields, MD ? ?  ?Assessment & Plan:  ?1.  Right AC joint sprain, likely grade 2 ?2.  Chronic supraspinatus tendinopathy ? ?Based on physical exam and ultrasound findings, Tiye does have an [facility] joint sprain, likely grade 2.  About 2 weeks out from her injury and has been seeing some pain improvement.  At this point I do not feel like immobilization in a sling is necessary.   We discussed that this will likely take about 4 to 6 weeks before healed.  She will continue to ice, she may apply topical Voltaren gel to help with inflammation as she did have an effusion noted on her ultrasound.  She may let pain be her guide in terms of activity below 90 degrees, but she is to avoid any activity that raises arm above 90 degrees or above her shoulders until this time.  If she is still having pain or not able to get back to activity in 1 month, she may present for reevaluation, otherwise follow-up as needed. ? ?Elba Barman, DO ?PGY-4, Sports Medicine Fellow ?[place] ? ?This note was dictated using Dragon naturally speaking software and may contain errors in syntax, spelling, or content which have not been identified prior to signing this note.  ? ?I observed and examined the patient with the resident and agree with assessment and plan.  Note reviewed and modified by me. ?Ila Mcgill, MD ? ? ?

## 2022-06-19 ENCOUNTER — Ambulatory Visit: Payer: Managed Care, Other (non HMO) | Admitting: Sports Medicine

## 2022-09-06 ENCOUNTER — Ambulatory Visit: Payer: Self-pay

## 2022-09-06 ENCOUNTER — Ambulatory Visit (INDEPENDENT_AMBULATORY_CARE_PROVIDER_SITE_OTHER): Payer: Managed Care, Other (non HMO) | Admitting: Sports Medicine

## 2022-09-06 VITALS — BP 120/78 | Ht 65.0 in | Wt 116.0 lb

## 2022-09-06 DIAGNOSIS — M19011 Primary osteoarthritis, right shoulder: Secondary | ICD-10-CM

## 2022-09-06 DIAGNOSIS — M25511 Pain in right shoulder: Secondary | ICD-10-CM

## 2022-09-06 NOTE — Progress Notes (Signed)
PCP: Harlan Stains, MD  Subjective:   HPI: Patient is a 67 y.o. female here for chronic R shoulder pain. Has history of chronic supraspinatus tendinopathy, triceps strain and partial R rotator cuff tear. Last injured the shoulder in March 2023 and diagnosed with R AC joint sprain which she thinks resolved.   Has been having this current R shoulder pain for past 2-3 months, worse with any movement although she is exercises despite it, doing one 45 minute class 5 days a week. Located at anterior shoulder. Cannot sleep on R side d/t pain. Takes tylenol, alieve and ibuprofen prn, usually a couple times a week.   Not sure if injured in weight lifting, classes or volleyball but certain overhead activity can cause it to flare.   BP 120/78   Ht 5' 5"$  (1.651 m)   Wt 116 lb (52.6 kg)   BMI 19.30 kg/m        Objective:  Physical Exam:  Gen: awake, alert, NAD, comfortable in exam room Pulm: breathing unlabored Shoulder: Inspection reveals no obvious deformity, atrophy, or asymmetry. No bruising. No swelling Palpation is normal with no TTP over [facility] joint or bicipital groove. Full ROM in flexion, abduction, internal/external rotation NV intact distally Normal scapular function observed. Special Tests:  - Impingement: Neg Hawkins, neers, positive empty can sign for pain. - Supraspinatous: empty can positive for pain but not weakness.  5/5 strength with resisted elevation at 20 degrees - Infraspinatous/Teres Minor: 5/5 strength with ER with mild pain - Subscapularis: 5/5 strength with IR - Biceps tendon: Negative Yerrgason's and speeds - AC Joint: Positive cross arm on the right - Negative apprehension test - No drop arm sign  ULTRASOUND: Shoulder, R  - Long head of the biceps tendon: No evidence of tendon thickening, calcification, subluxation, or tearing in short or long axis views. No edema or bullseye sign.  - Subscapularis tendon: complete visualization across the width of the  insertion point yielded no evidence of tendon thickening, calcification, or tears in the long axis view.  - Supraspinatus tendon: complete visualization across the width of the insertion point yielded no evidence of tendon thickening, calcification, or tears in the long axis view. No evidence of bursal inflammation appreciated.  - Infraspinatus and teres minor tendons: visualization across the width of the insertion points yielded no evidence of tendon thickening, calcification, or tears in the long axis view.  [facility] Joint: No evidence of joint separation appreciated. Bone spur present and mild joint effusion IMPRESSION: findings consistent with arthritis of AC joint.  Ultrasound and interpretation by Wolfgang Phoenix. Fields, MD       Assessment & Plan:   Arthritis of AC joint Bone spur and very mild effusion seen at [facility] joint on ultrasound.  Patient advised to exercises requiring overhead motion or bringing arms to midline.  Rotator cuff muscle strains No rotator cuff injury seen on ultrasound although she has weakness with external rotation and pain with empty can sign indicating strains of infraspinatus and supraspinatus muscles.  Patient provided with rotator cuff muscle strengthening exercises to do for 6 weeks.  Can use Voltaren gel and ice for pain if needed.  Return only as needed.  Precious Gilding, DO Family Medicine Resident, PGY-2  I observed and examined the patient with the resident and agree with assessment and plan.  Note reviewed and modified by me. Ila Mcgill, MD

## 2022-09-06 NOTE — Assessment & Plan Note (Signed)
Conservative care Avoid at risk positions Work on standard REHAB for RC injury  Reck if not resolved

## 2022-10-29 ENCOUNTER — Encounter: Payer: Self-pay | Admitting: *Deleted

## 2023-01-15 ENCOUNTER — Other Ambulatory Visit: Payer: Self-pay | Admitting: Family Medicine

## 2023-01-15 DIAGNOSIS — R6889 Other general symptoms and signs: Secondary | ICD-10-CM

## 2023-01-15 DIAGNOSIS — R479 Unspecified speech disturbances: Secondary | ICD-10-CM

## 2023-01-18 ENCOUNTER — Encounter: Payer: Self-pay | Admitting: Family Medicine

## 2023-01-21 ENCOUNTER — Ambulatory Visit
Admission: RE | Admit: 2023-01-21 | Discharge: 2023-01-21 | Disposition: A | Discharge status: Home / Self Care | Payer: Managed Care, Other (non HMO) | Source: Ambulatory Visit | Attending: Family Medicine | Admitting: Family Medicine

## 2023-01-21 DIAGNOSIS — R479 Unspecified speech disturbances: Secondary | ICD-10-CM

## 2023-01-21 DIAGNOSIS — R6889 Other general symptoms and signs: Secondary | ICD-10-CM

## 2023-03-05 ENCOUNTER — Other Ambulatory Visit: Payer: Self-pay

## 2023-03-05 ENCOUNTER — Encounter: Payer: Self-pay | Admitting: Sports Medicine

## 2023-03-05 ENCOUNTER — Ambulatory Visit (INDEPENDENT_AMBULATORY_CARE_PROVIDER_SITE_OTHER): Payer: Managed Care, Other (non HMO) | Admitting: Sports Medicine

## 2023-03-05 VITALS — BP 100/70 | Ht 66.0 in | Wt 115.0 lb

## 2023-03-05 DIAGNOSIS — S9032XA Contusion of left foot, initial encounter: Secondary | ICD-10-CM | POA: Insufficient documentation

## 2023-03-05 DIAGNOSIS — M25572 Pain in left ankle and joints of left foot: Secondary | ICD-10-CM | POA: Diagnosis not present

## 2023-03-05 NOTE — Assessment & Plan Note (Addendum)
-   Limited ultrasound shows edema of the plantar fascia at the point of a heel spur with what looks like a small free-floating calcification as well.   -We will have her start calf raises daily with icing and as needed anti-inflammatories. -Heel cup given for padding. -Follow-up in 1 month for reassessment.  If no improvement we can consider EC SWT

## 2023-03-05 NOTE — Progress Notes (Unsigned)
Anita Odonnell - 67 y.o. female MRN [id number]  Date of birth: 1956-02-08  PCP: Laurann Montana, MD  Subjective:  No chief complaint on file.  Foot pain  HPI: Past Medical, Surgical, Social, and Family History Reviewed & Updated per EMR.   Patient is a 67 y.o. female here for severity: Moderate, timing: intermittent, initially improved but now worsening, localized, pain located on the lateral plantar aspect of her L heel that started 2 months ago, improved with rest, and then recurred with increased activity. The most recent episode was 1 week ago when playing pickle ball. She stepped off with her L foot and felt sharp pain that prevented her from finishing play. It is exacerbated by walking, running, jumping and alleviated by rest. The patient has tried no therapies which have helped. She denies any bruising, swelling, numbness, weakness, fever, or chills.   Past Medical History:  Diagnosis Date   Hypothyroidism     Current Outpatient Medications on File Prior to Visit  Medication Sig Dispense Refill   diclofenac Sodium (VOLTAREN) 1 % GEL Apply 4 g topically 4 (four) times daily. (Patient not taking: Reported on 09/06/2022) 4 g 0   No current facility-administered medications on file prior to visit.    Past Surgical History:  Procedure Laterality Date   DILATION AND CURETTAGE OF UTERUS     SHOULDER SURGERY Right 1997   torn labrium    No Known Allergies      Objective:  Physical Exam: VS: BP:100/70  HR: bpm  TEMP: ( )  RESP:   HT:5\' 6"  (167.6 cm)   WT:115 lb (52.2 kg)  BMI:18.57  Gen: NAD, speaks clearly, comfortable in exam room Respiratory: normal work of breathing on room air Skin: No rashes, abrasions, or ecchymosis MSK: athletic build w/ good overall tone Antalgic gait favoring L side. No sensory deficits.  Left Foot:  Observation: no obvious deformity, ecchymosis or edema ROM of passive IR and ER of forefoot: normal and NT ROM Phalanges: full Long arch:  preserved Transverse arch: preserved Callus pattern: none Talar dome NTTP, Navicular NT, Cuboid: NT ATFL: NT, CFL: NT Deltoid: NT Calcaneous: ttp: TTP Over the mid lateral plantar aspect.  Plantar Fascia: no obvious tightness or edema, Fat Pad: NT Achilles: NT Peroneals: NT Post Tib: NT 5th MT: NTTP, no edema 1st TMT: NT, no deformity, edema or erythema Phalanges: NT Great Toe: ROM normal flex/extend Other foot breakdown: none Hindfoot breakdown: none Dorsalis Pedis pulse: 2+, and equal bilat Sensation: intact   Ultrasound:  Limited Ultrasound of the Left Foot:  - Calcaneal origin of the plantar fascia viewed in SAX and LAX. Osteophyte visualized at the proximal midline plantar aspect w/ small free floating calcification superficially. Intra tendonous hypoechoic changes present immediately distal to the calcification w/ thickening of the fascia and surrounding soft tissue edema superficially. No hypoechoic changes to the underlying space or soft tissue. Tendon in tact measuring at 0.43cm thickness at area of edema but otherwise <0.3cm.   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 Webb Silversmith and Dr. Darrick Penna      Assessment & Plan:   Contusion of heel, initial encounter - Limited ultrasound shows edema of the plantar fascia at the point of a heel spur with what looks like a small free-floating calcification as well.   -We will have her start calf raises daily with icing and as needed anti-inflammatories. -Heel cup given for padding. -Follow-up in 1 month  for reassessment.  If no improvement we can consider EC SWT    Rica Mote MD [facility] Sports Medicine Fellow  I observed and examined the patient with the resident and agree with assessment and plan.  Note reviewed and modified by me. Sterling Big, MD

## 2023-04-02 ENCOUNTER — Ambulatory Visit: Payer: Managed Care, Other (non HMO) | Admitting: Sports Medicine

## 2023-04-02 ENCOUNTER — Ambulatory Visit: Payer: Managed Care, Other (non HMO) | Admitting: Neurology

## 2023-04-16 ENCOUNTER — Encounter: Payer: Self-pay | Admitting: Neurology

## 2023-04-16 ENCOUNTER — Ambulatory Visit (INDEPENDENT_AMBULATORY_CARE_PROVIDER_SITE_OTHER): Payer: Managed Care, Other (non HMO) | Admitting: Neurology

## 2023-04-16 ENCOUNTER — Ambulatory Visit: Payer: Managed Care, Other (non HMO) | Admitting: Family Medicine

## 2023-04-16 VITALS — BP 130/74 | HR 59 | Ht 65.0 in | Wt 115.0 lb

## 2023-04-16 DIAGNOSIS — R6889 Other general symptoms and signs: Secondary | ICD-10-CM | POA: Diagnosis not present

## 2023-04-16 DIAGNOSIS — R479 Unspecified speech disturbances: Secondary | ICD-10-CM | POA: Diagnosis not present

## 2023-04-16 NOTE — Progress Notes (Signed)
[facility]  PATIENT: Anita Odonnell DOB: [birth year]  REQUESTING CLINICIAN: Deatra James, MD HISTORY FROM: Patient  REASON FOR VISIT: Abnormal speech/Abnormal writing    HISTORICAL  CHIEF COMPLAINT:  Chief Complaint  Patient presents with   New Patient (Initial Visit)    Rm 12. Patient alone, Changes in speech and writing, 06.10.24 is when she first noticed problem with writing, changed all of a sudden. Patient reports speech got worse and now seems better.     HISTORY OF PRESENT ILLNESS:  This is a 67 year old woman with no reported past medical history who is presenting with episode of abnormal speech and difficulty with writing.  The episode started in June.  The speech difficulty described as hard to get the words out but patient reports that speech has improved to back to normal but her writing is still a problem.  She starts writing normally but by the end of the sentence her words become illegible.  She denies any trauma, denies any weakness, denies any slowness of movement.  Denies any fall or abnormal gait, denies any REM sleep behavior and no constipation.  Denies loss of smell or taste. No family history of parkinsonism but mother has dementia.  She did have a MRI brain in July which showed nonspecific T2 flair abnormality otherwise normal.    OTHER MEDICAL CONDITIONS: Denies    REVIEW OF SYSTEMS: Full 14 system review of systems performed and negative with exception of: As noted in the HPI   ALLERGIES: No Known Allergies  HOME MEDICATIONS: Outpatient Medications Prior to Visit  Medication Sig Dispense Refill   Calcium-Vitamin D-Vitamin K (VIACTIV CALCIUM PLUS D) 650-12.5-40 MG-MCG-MCG CHEW Chew 1 tablet by mouth daily.     diclofenac Sodium (VOLTAREN) 1 % GEL Apply 4 g topically 4 (four) times daily. (Patient not taking: Reported on 09/06/2022) 4 g 0   No facility-administered medications prior to visit.    PAST MEDICAL HISTORY: Past Medical  History:  Diagnosis Date   Hypothyroidism     PAST SURGICAL HISTORY: Past Surgical History:  Procedure Laterality Date   DILATION AND CURETTAGE OF UTERUS     SHOULDER SURGERY Right 1997   torn labrium    FAMILY HISTORY: Family History  Problem Relation Age of Onset   Hypertension Mother    Hypercholesterolemia Mother    Cancer Father        MDS    SOCIAL HISTORY: Social History   Socioeconomic History   Marital status: Single    Spouse name: Not on file   Number of children: Not on file   Years of education: Not on file   Highest education level: Not on file  Occupational History   Not on file  Tobacco Use   Smoking status: Never   Smokeless tobacco: Never  Substance and Sexual Activity   Alcohol use: Yes    Alcohol/week: 2.0 standard drinks of alcohol    Types: 2 Glasses of wine per week   Drug use: Not on file   Sexual activity: Not on file  Other Topics Concern   Not on file  Social History Narrative   Not on file   Social Determinants of Health   Financial Resource Strain: Not on file  Food Insecurity: Not on file  Transportation Needs: Not on file  Physical Activity: Not on file  Stress: Not on file  Social Connections: Not on file  Intimate Partner Violence: Not on file    PHYSICAL EXAM  GENERAL  EXAM/CONSTITUTIONAL: Vitals:  Vitals:   04/16/23 0843  BP: 130/74  Pulse: (!) 59  Weight: 115 lb (52.2 kg)  Height: 5\' 5"  (1.651 m)   Body mass index is 19.14 kg/m. Wt Readings from Last 3 Encounters:  04/16/23 115 lb (52.2 kg)  03/05/23 115 lb (52.2 kg)  09/06/22 116 lb (52.6 kg)   Patient is in no distress; well developed, nourished and groomed; neck is supple  MUSCULOSKELETAL: Gait, strength, tone, movements noted in Neurologic exam below  NEUROLOGIC: MENTAL STATUS:      No data to display         awake, alert, oriented to person, place and time recent and remote memory intact normal attention and concentration language  fluent, comprehension intact, naming intact fund of knowledge appropriate  CRANIAL NERVE:  2nd, 3rd, 4th, 6th - Visual fields full to confrontation, extraocular muscles intact, no nystagmus 5th - facial sensation symmetric 7th - facial strength symmetric 8th - hearing intact 9th - palate elevates symmetrically, uvula midline 11th - shoulder shrug symmetric 12th - tongue protrusion midline  MOTOR:  normal bulk and tone, full strength in the BUE, BLE  SENSORY:  normal and symmetric to light touch  COORDINATION:  finger-nose-finger, fine finger movements normal. Normal finger tapping, no dysdiadochokinesia   GAIT/STATION:  normal     DIAGNOSTIC DATA (LABS, IMAGING, TESTING) - I reviewed patient records, labs, notes, testing and imaging myself where available.  Lab Results  Component Value Date   WBC 3.0 (L) 12/30/2013   HGB 13.5 12/30/2013   HCT 40.8 12/30/2013   MCV 95.8 12/30/2013   PLT 303 12/30/2013      Component Value Date/Time   NA 139 12/30/2013 1340   K 4.2 12/30/2013 1340   CO2 28 12/30/2013 1340   GLUCOSE 115 12/30/2013 1340   BUN 15.1 12/30/2013 1340   CREATININE 0.8 12/30/2013 1340   CALCIUM 9.5 12/30/2013 1340   PROT 6.9 12/30/2013 1340   ALBUMIN 4.1 12/30/2013 1340   AST 31 12/30/2013 1340   ALT 35 12/30/2013 1340   ALKPHOS 64 12/30/2013 1340   BILITOT 0.53 12/30/2013 1340   No results found for: "CHOL", "HDL", "LDLCALC", "LDLDIRECT", "TRIG", "CHOLHDL" No results found for: "HGBA1C" No results found for: "VITAMINB12" No results found for: "TSH"   MRI Brain 01/23/2023 1.  No evidence of an acute intracranial abnormality. 2. There are a few small nonspecific T2 FLAIR hyperintense chronic insults within the cerebral white matter. Otherwise unremarkable non-contrast MRI appearance of the brain.    ASSESSMENT AND PLAN  67 y.o. year old female with no reported past medical history who is presenting with few month history of speech disturbance  and writing difficulty.  She reports that her speech disturbance has improved to back to normal but she still having difficulty with writing, her sentences start normally but by the end her word become illegible.  On exam there was no focal deficit, her brain MRI did not show any acute abnormality other than a few small nonspecific T2 flair hyperintensity.  She does not have any signs of underlying movement disorder.  Inform patient that the cause of her writing issue is unclear at the moment but we will continue to monitor it.  For now advised her to take her time when writing but if the symptoms do get worse to come back.  She voiced understanding.   1. Speech disturbance, unspecified type   2. Difficulty writing      Patient Instructions  Continue  to follow up with PCP  Return as needed  No orders of the defined types were placed in this encounter.   No orders of the defined types were placed in this encounter.   Return if symptoms worsen or fail to improve.    Windell Norfolk, MD 04/16/2023, 9:17 AM  [facility] [address], [address] [place], [place] [address] [phone]

## 2023-04-16 NOTE — Patient Instructions (Signed)
Continue to follow up with PCP  Return as needed  

## 2023-04-17 ENCOUNTER — Ambulatory Visit (INDEPENDENT_AMBULATORY_CARE_PROVIDER_SITE_OTHER): Payer: Self-pay | Admitting: Sports Medicine

## 2023-04-17 DIAGNOSIS — M79672 Pain in left foot: Secondary | ICD-10-CM

## 2023-04-17 NOTE — Progress Notes (Unsigned)
PCP: Laurann Montana, MD  SUBJECTIVE:   HPI:  Patient is a 67 y.o. female here for follow-up of left heel pain. 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 She notes that since last appointment she has not improved with stretching and anti-inflammatory therapy, though notes she hasn't been consistent with the stretches. She was not able to tolerate the heel cup 2/2 pain. Does not improvement in pain with rolling out with ice. She presents today for ECSWT.  ROS:     See HPI  PERTINENT  PMH / PSH FH / / SH:  Past Medical, Surgical, Social, and Family History Reviewed & Updated in the EMR.  Pertinent findings include: Non-contributory  No Known Allergies  OBJECTIVE:  There were no vitals taken for this visit.  PHYSICAL EXAM:  GEN: Alert and Oriented, NAD, comfortable in exam room RESP: Unlabored respirations, symmetric chest rise PSY: normal mood, congruent affect   LEFT FOOT MSK EXAM: No obvious deformity, ecchymosis or edema. Ankle and foot with full ROM. Stable ankle joint. Reproducible TTP at the medial and lateral proximal plantar fascial attachment to calcaneous. Posterior calcaneous and achilles tendon NT. Negative Windlass. No TTP distally down plantar fascia.    ASSESSMENT & PLAN:  1. Pain of left heel Patient with recent diagnostic MSK U/S with calcaneal osteophyte and plantar fascial defect here for ECSWT. We discussed the risks/benefits/alternatives and patient agreed to proceed. Consent forms completed. Advised this will likely take at least 3-4 sessions prior to improvement, however she should gauge response to initial treatment to determine if there is some appreciable benefit. Encouraged her to resume the calf stretching exercises as well. F/u in 1 week.  Procedure: ECSWT Indications:  Heel Pain   Procedure Details Consent: Risks of procedure as well as the  alternatives and risks of each were explained to the patient.  Written consent for procedure obtained. Time Out: Verified patient identification, verified procedure, site was marked, verified correct patient position, medications/allergies/relevent history reviewed.  The area was cleaned with alcohol swab.     The left heel/plantar fascia was targeted for Extracorporeal shockwave therapy.  Preset: Plantar Fasciitis Power Level: 100 mJ Frequency: 12 Hz Impulse/cycles: 2000 Head size: Large   Patient tolerated procedure well without immediate complications     Glean Salen, MD PGY-4, Sports Medicine Fellow [facility]  Addendum:  Patient seen in the office by fellow.  His history, exam, plan of care were precepted with me.  Norton Blizzard MD Marrianne Mood

## 2023-04-25 ENCOUNTER — Ambulatory Visit (INDEPENDENT_AMBULATORY_CARE_PROVIDER_SITE_OTHER): Payer: Self-pay | Admitting: Sports Medicine

## 2023-04-25 DIAGNOSIS — M79672 Pain in left foot: Secondary | ICD-10-CM

## 2023-04-26 NOTE — Progress Notes (Signed)
   PCP: Laurann Montana, MD  SUBJECTIVE:   HPI:  Patient is a 67 y.o. female here for follow-up of left heel pain. Underwent first ECSWT last week. Notes no significant improvement since last week. Pain still at the medial aspect of the left heel. Doing foot and calf stretches. Recently got new NB running shoes, but primarily is wearing zero-drop shoes without additional arch support. Was tried on heel cups, though notes these made pain worse last month and has discontinued use.   ROS:     See HPI  PERTINENT  PMH / PSH FH / / SH:  Past Medical, Surgical, Social, and Family History Reviewed & Updated in the EMR.  Pertinent findings include:  Non-contributory   ASSESSMENT & PLAN:  1. Pain of left heel Patient with recent diagnostic MSK U/S with calcaneal osteophyte and plantar fascial defect here for ECSWT. No significant improvement after first session, again advised this will likely take at least 3-4 sessions prior to improvement, however she should gauge response to this treatment and if she doesn't notice any difference we may change course and make orthotics at follow-up. Encouraged her to continue the calf and foot stretching exercises as well. F/u in 1 week.   Procedure: ECSWT Indications:  Heel Pain   Procedure Details Consent: Risks of procedure as well as the alternatives and risks of each were explained to the patient.  Written consent for procedure obtained. Time Out: Verified patient identification, verified procedure, site was marked, verified correct patient position, medications/allergies/relevent history reviewed.  The area was cleaned with alcohol swab.     The left heel/plantar fascia was targeted for Extracorporeal shockwave therapy.  Preset: Plantar Fasciitis Power Level: 100 mJ Frequency: 12 Hz Impulse/cycles: 2000 Head size: Large   Patient tolerated procedure well without immediate complications   Glean Salen, MD PGY-4, Sports Medicine Fellow [facility]  [facility]  I observed and examined the patient with the [facility] resident and agree with assessment and plan.  Note reviewed and modified by me. Sterling Big, MD

## 2023-05-02 ENCOUNTER — Ambulatory Visit (INDEPENDENT_AMBULATORY_CARE_PROVIDER_SITE_OTHER): Payer: Self-pay | Admitting: Sports Medicine

## 2023-05-02 DIAGNOSIS — M79672 Pain in left foot: Secondary | ICD-10-CM

## 2023-05-02 NOTE — Progress Notes (Signed)
    HPI:  Patient is a 67 y.o. female here for follow-up ECSWT of left heel pain. She has undergone 2 sessions the past couple weeks and notes about 20% improvement. She has continued to run and play pickleball with intermittent worsening of her pain, though overall is noting improvement. She has resumed using the heel cups without difficulty.     Procedure: ECSWT, session 3 Indications:  Left Heel Pain   Procedure Details Consent: Risks of procedure as well as the alternatives and risks of each were explained to the patient.  Written consent for procedure obtained. Time Out: Verified patient identification, verified procedure, site was marked, verified correct patient position, medications/allergies/relevent history reviewed.  The area was cleaned with alcohol swab.     The left heel/plantar fascia was targeted for Extracorporeal shockwave therapy.  Preset: Plantar Fasciitis Power Level: 110 mJ Frequency: 12 Hz Impulse/cycles: 2000 Head size: Large   Patient tolerated procedure well without immediate complications. Advised f/u in 1 week for next session.   Glean Salen, MD PGY-4, Sports Medicine Fellow [facility]  I supervised the ESWT for this patient and agree with the plan. Sterling Big, MD

## 2023-05-06 ENCOUNTER — Encounter: Payer: Self-pay | Admitting: Family Medicine

## 2023-05-06 ENCOUNTER — Ambulatory Visit (INDEPENDENT_AMBULATORY_CARE_PROVIDER_SITE_OTHER): Payer: Self-pay | Admitting: Family Medicine

## 2023-05-06 DIAGNOSIS — M79672 Pain in left foot: Secondary | ICD-10-CM

## 2023-05-06 NOTE — Patient Instructions (Signed)

## 2023-05-06 NOTE — Progress Notes (Signed)
FOLLOW-UP VISIT:  Shockwave Therapy Procedure   NAME:  Anita Odonnell DOB:  1955/10/09 MR#: [id number] DATE OF VISIT:  05/06/2023  Encounter: Anita Odonnell comes in for a follow up evaluation and her  4th Extracorporeal Shockwave therapy (ESWT) treatment to the left heel.  Anita Odonnell reports 80% improvement overall since starting treatments.  Continues to play pickleball without issues.  She  denies any fevers, chills, recent illness or infections.   ESWT Procedure Note:  Procedure: ECSWT, session 4 Indications:  Left Heel Pain Procedure Details Consent: Risks of procedure as well as the alternatives and risks of each were explained to the patient.  Written consent for procedure obtained. Time Out: Verified patient identification, verified procedure, site was marked, verified correct patient position, medications/allergies/relevent history reviewed.  The area was cleaned with alcohol swab.     The left heel/plantar fascia was targeted for Extracorporeal shockwave therapy.  Preset: Plantar Fasciitis Power Level: 110 mJ Frequency: 12 Hz Impulse/cycles: 2000 Head size: Large  Complications:  None; patient tolerated the procedure well.  Assessment & Plan Pain of left heel Improving with ESWT - 80% improved overall today  1. Return in 1 week for 5th ESWT procedure -- plan for that to be last session most likely depending on her progress. 2. Procedure aftercare reviewed 3. Recommended avoiding strenuous activities and using OTC analgesia prn.

## 2023-05-15 ENCOUNTER — Ambulatory Visit: Payer: Managed Care, Other (non HMO) | Admitting: Family Medicine

## 2023-07-17 ENCOUNTER — Other Ambulatory Visit: Payer: Self-pay | Admitting: Medical Genetics

## 2023-08-16 ENCOUNTER — Other Ambulatory Visit (HOSPITAL_COMMUNITY)
Admission: RE | Admit: 2023-08-16 | Discharge: 2023-08-16 | Disposition: A | Discharge status: Home / Self Care | Payer: Self-pay | Source: Ambulatory Visit | Attending: Oncology | Admitting: Oncology

## 2023-08-26 LAB — GENECONNECT MOLECULAR SCREEN: Genetic Analysis Overall Interpretation: NEGATIVE

## 2024-01-27 ENCOUNTER — Encounter: Payer: Self-pay | Admitting: Family Medicine

## 2024-01-27 ENCOUNTER — Ambulatory Visit: Admitting: Family Medicine

## 2024-01-27 VITALS — BP 122/82 | Ht 66.0 in | Wt 111.0 lb

## 2024-01-27 DIAGNOSIS — S86112A Strain of other muscle(s) and tendon(s) of posterior muscle group at lower leg level, left leg, initial encounter: Secondary | ICD-10-CM

## 2024-01-27 NOTE — Patient Instructions (Signed)
 You have a calf strain Icing for 15 minutes at a time 3-4 times a day as needed. Heel lifts either in temporary orthotics or on their own to prevent further strain. Tylenol and/or aleve for pain. Compression sleeve when playing may be helpful for the first 4-6 weeks. Do home exercises daily. Consider shockwave weekly (we did the first treatment today) for 4-6 total sessions.

## 2024-01-27 NOTE — Progress Notes (Addendum)
 PCP: Teresa Channel, MD  Subjective:   HPI: Patient is a 68 y.o. female here for left calf pain.  Anita Odonnell presents today for evaluation of left calf pain x 2 weeks.  She noted onset of pain after playing pickle ball.  She initially felt a knot along the left calf and attempted to manage her discomfort with heating, stretching, and using a foam roller.  She attempted to return to play but continued to experience discomfort and weakness with pushing off.  She underwent acupuncture on 7/3 and tried to play pickle ball on 7/4, but symptoms returned.  She has also been using a compression sleeve and applying diclofenac  gel as needed for pain relief.  Pain is improving but she continues to experience discomfort when pushing off.  She went walking around her neighborhood over the weekend but quickly returned home because she felt a pulling sensation when lunging forward.  Of note, she endorses a similar injury to the right leg in May of last year.  Symptoms improved with shockwave therapy, which she is interested in today.  Past Medical History:  Diagnosis Date   Hypothyroidism     Current Outpatient Medications on File Prior to Visit  Medication Sig Dispense Refill   Calcium-Vitamin D-Vitamin K (VIACTIV CALCIUM PLUS D) 650-12.5-40 MG-MCG-MCG CHEW Chew 1 tablet by mouth daily.     diclofenac  Sodium (VOLTAREN ) 1 % GEL Apply 4 g topically 4 (four) times daily. (Patient not taking: Reported on 09/06/2022) 4 g 0   No current facility-administered medications on file prior to visit.    Past Surgical History:  Procedure Laterality Date   DILATION AND CURETTAGE OF UTERUS     SHOULDER SURGERY Right 1997   torn labrium    No Known Allergies  BP 122/82   Ht 5' 6 (1.676 m)   Wt 111 lb (50.3 kg)   BMI 17.92 kg/m      08/30/2020    8:26 AM 10/10/2021   11:32 AM  [facility] Adult Exercise  Frequency of aerobic exercise (# of days/week) 3 2  Average time in minutes 60 45  Frequency  of strengthening activities (# of days/week) 2 3        No data to display              Objective:  Physical Exam:  Gen: NAD, comfortable in exam room  Left leg No gross deformity on inspection FROM of the left knee and ankle No tenderness to palpation. Strength and sensation are grossly intact Negative Thompson's  Limited MSK ultrasound -left leg Normal appearance of the left gastrocnemius and soleus.  Compressible venous structures in calf.   Assessment & Plan:  1.  Left calf strain -evaluated today for left calf pain x 2 weeks as described above.  Her exam today is reassuring.  No concerning findings on limited ultrasound of the left leg.  I suspect a grade 1 strain of the left gastrocnemius.  She is completing conservative treatment measures as described above and is additionally interested in ECSWT as it was beneficial in treating a similar injury in the past.  This was completed today.  See procedure note below.  Recommend continuing current treatment measures.  Resume heel lifts/orthotics as they have been helpful previously.  Recommend rest x 1 week and then a gradual return to play.  She will tentatively schedule shockwave treatment for next week for a total of 4-6 sessions.  Procedure: ECSWT Indications:  Left Gastrocnemius Strain  Procedure Details Consent: Risks of procedure as well as the alternatives and risks of each were explained to the patient.  Written consent for procedure obtained. Time Out: Verified patient identification, verified procedure, site was marked, verified correct patient position, medications/allergies/relevent history reviewed.  The area was cleaned with alcohol swab.     The left calf was targeted for Extracorporeal shockwave therapy.    Preset: None Power Level: 60 mJ initially, increased to 80 mJ by end of treatment Frequency: 12 Hz Impulse/cycles: 2000 Head size: Large   Patient tolerated procedure well without immediate  complications

## 2024-02-03 ENCOUNTER — Ambulatory Visit (INDEPENDENT_AMBULATORY_CARE_PROVIDER_SITE_OTHER): Payer: Self-pay | Admitting: Family Medicine

## 2024-02-03 DIAGNOSIS — S86112D Strain of other muscle(s) and tendon(s) of posterior muscle group at lower leg level, left leg, subsequent encounter: Secondary | ICD-10-CM

## 2024-02-03 NOTE — Progress Notes (Signed)
 Patient returns for second shockwave treatment of her left gastrocnemius. Overall she feels much better - plans to hold on additional treatments after today and continue her home exercises, orthotics.  Procedure: ECSWT Indications:  Left gastrocnemius strain   Procedure Details Consent: Risks of procedure as well as the alternatives and risks of each were explained to the patient.  Written consent for procedure obtained. Time Out: Verified patient identification, verified procedure, site was marked, verified correct patient position, medications/allergies/relevent history reviewed.  The area was cleaned with alcohol swab.     The Left calf muscle was targeted for Extracorporeal shockwave therapy.    Preset: status post muscle injury Power Level: 90 Frequency: 14 Hz Impulse/cycles: 2000 Head size: large   Patient tolerated procedure well without immediate complications

## 2024-09-08 ENCOUNTER — Ambulatory Visit: Admitting: Sports Medicine
